# Patient Record
Sex: Female | Born: 1960 | Race: White | Hispanic: No | Marital: Married | Smoking: Former smoker
Health system: Southern US, Community
[De-identification: ages and names within clinical notes are randomized; demographics above are authoritative.]

## PROBLEM LIST (undated history)

## (undated) DIAGNOSIS — Z87442 Personal history of urinary calculi: Secondary | ICD-10-CM

## (undated) DIAGNOSIS — I10 Essential (primary) hypertension: Secondary | ICD-10-CM

## (undated) DIAGNOSIS — M1990 Unspecified osteoarthritis, unspecified site: Secondary | ICD-10-CM

## (undated) DIAGNOSIS — C801 Malignant (primary) neoplasm, unspecified: Secondary | ICD-10-CM

## (undated) DIAGNOSIS — E079 Disorder of thyroid, unspecified: Secondary | ICD-10-CM

## (undated) DIAGNOSIS — E039 Hypothyroidism, unspecified: Secondary | ICD-10-CM

## (undated) HISTORY — PX: ABDOMINAL HYSTERECTOMY: SHX81

## (undated) HISTORY — PX: CHOLECYSTECTOMY: SHX55

## (undated) HISTORY — PX: TONSILLECTOMY: SUR1361

## (undated) HISTORY — PX: CARPAL TUNNEL RELEASE: SHX101

---

## 1982-09-04 DIAGNOSIS — C801 Malignant (primary) neoplasm, unspecified: Secondary | ICD-10-CM

## 1982-09-04 HISTORY — DX: Malignant (primary) neoplasm, unspecified: C80.1

## 1998-10-01 ENCOUNTER — Other Ambulatory Visit: Admission: RE | Admit: 1998-10-01 | Discharge: 1998-10-01 | Payer: Self-pay | Admitting: Orthopedic Surgery

## 2005-06-29 ENCOUNTER — Ambulatory Visit (HOSPITAL_COMMUNITY): Admission: RE | Admit: 2005-06-29 | Discharge: 2005-06-29 | Payer: Self-pay | Admitting: Internal Medicine

## 2008-04-24 ENCOUNTER — Ambulatory Visit (HOSPITAL_COMMUNITY): Admission: RE | Admit: 2008-04-24 | Discharge: 2008-04-24 | Payer: Self-pay | Admitting: Internal Medicine

## 2015-02-24 ENCOUNTER — Encounter (INDEPENDENT_AMBULATORY_CARE_PROVIDER_SITE_OTHER): Payer: Self-pay | Admitting: *Deleted

## 2015-04-28 ENCOUNTER — Emergency Department (HOSPITAL_COMMUNITY): Payer: 59

## 2015-04-28 ENCOUNTER — Emergency Department (HOSPITAL_COMMUNITY)
Admission: EM | Admit: 2015-04-28 | Discharge: 2015-04-28 | Disposition: A | Discharge status: Home / Self Care | Payer: 59 | Attending: Emergency Medicine | Admitting: Emergency Medicine

## 2015-04-28 ENCOUNTER — Encounter (HOSPITAL_COMMUNITY): Payer: Self-pay | Admitting: Emergency Medicine

## 2015-04-28 DIAGNOSIS — N200 Calculus of kidney: Secondary | ICD-10-CM | POA: Diagnosis not present

## 2015-04-28 DIAGNOSIS — Z79899 Other long term (current) drug therapy: Secondary | ICD-10-CM | POA: Insufficient documentation

## 2015-04-28 DIAGNOSIS — E079 Disorder of thyroid, unspecified: Secondary | ICD-10-CM | POA: Insufficient documentation

## 2015-04-28 DIAGNOSIS — R109 Unspecified abdominal pain: Secondary | ICD-10-CM | POA: Diagnosis present

## 2015-04-28 HISTORY — DX: Disorder of thyroid, unspecified: E07.9

## 2015-04-28 LAB — URINALYSIS, ROUTINE W REFLEX MICROSCOPIC
Bilirubin Urine: NEGATIVE
Glucose, UA: NEGATIVE mg/dL
Ketones, ur: NEGATIVE mg/dL
Leukocytes, UA: NEGATIVE
Nitrite: NEGATIVE
Specific Gravity, Urine: >1.03 — ABNORMAL HIGH (ref 1.005–1.030)
Urobilinogen, UA: 0.2 mg/dL (ref 0.0–1.0)
pH: 5.5 (ref 5.0–8.0)

## 2015-04-28 LAB — URINE MICROSCOPIC-ADD ON

## 2015-04-28 MED ORDER — OXYCODONE-ACETAMINOPHEN 5-325 MG PO TABS
1.0000 | ORAL_TABLET | Freq: Once | ORAL | Status: AC
Start: 2015-04-28 — End: 2015-04-28
  Administered 2015-04-28: 1 via ORAL
  Filled 2015-04-28: qty 1

## 2015-04-28 MED ORDER — TAMSULOSIN HCL 0.4 MG PO CAPS
0.4000 mg | ORAL_CAPSULE | Freq: Every day | ORAL | Status: DC
Start: 2015-04-28 — End: 2018-08-19

## 2015-04-28 MED ORDER — ONDANSETRON 8 MG PO TBDP
8.0000 mg | ORAL_TABLET | Freq: Once | ORAL | Status: AC
  Administered 2015-04-28: 8 mg via ORAL
  Filled 2015-04-28: qty 1

## 2015-04-28 MED ORDER — ONDANSETRON 8 MG PO TBDP: 8.0000 mg | ORAL_TABLET | Freq: Once | ORAL | Status: DC

## 2015-04-28 MED ORDER — KETOROLAC TROMETHAMINE 30 MG/ML IJ SOLN
30.0000 mg | Freq: Once | INTRAMUSCULAR | Status: AC
  Administered 2015-04-28: 30 mg via INTRAMUSCULAR
  Filled 2015-04-28: qty 1

## 2015-04-28 MED ORDER — HYDROCODONE-ACETAMINOPHEN 5-325 MG PO TABS: 1.0000 | ORAL_TABLET | Freq: Four times a day (QID) | ORAL | Status: DC | PRN

## 2015-04-28 NOTE — ED Provider Notes (Signed)
MSE was initiated and I personally evaluated the patient and placed orders (if any) at  6:52 AM on April 28, 2015.  The patient appears stable so that the remainder of the MSE may be completed by another provider. Pt declines IV pain meds at this time   Ripley Fraise, MD 04/28/15 [phone]

## 2015-04-28 NOTE — ED Provider Notes (Signed)
CSN: [id number]     Arrival date & time 04/28/15  0600 History   First MD Initiated Contact with Patient 04/28/15 [phone]     Chief Complaint  Patient presents with  . Flank Pain     (Consider location/radiation/quality/duration/timing/severity/associated sxs/prior Treatment) HPI Patient presents with new right-sided flank pain. Pain began with the past 2 hours, starting gradually. Since onset pain has been persistent, with waxing/waning severity. The pain is focally in the right flank, nonradiating. There is no associated dysuria, hematuria, other abdominal or thoracic pain. There is associated nausea, no vomiting. Patient states that the pain is similar to pain she expressed during prior kidney stone, in the distant past. She states that she is otherwise generally well. Since arrival to the facility the patient has received medication, states that this has diminished her pain.  Past Medical History  Diagnosis Date  . Thyroid disease    Past Surgical History  Procedure Laterality Date  . Abdominal hysterectomy    . Tonsillectomy    . Cholecystectomy     History reviewed. No pertinent family history. Social History  Substance Use Topics  . Smoking status: Never Smoker   . Smokeless tobacco: None  . Alcohol Use: No   OB History    No data available     Review of Systems  Constitutional:       Per HPI, otherwise negative  HENT:       Per HPI, otherwise negative  Respiratory:       Per HPI, otherwise negative  Cardiovascular:       Per HPI, otherwise negative  Gastrointestinal: Negative for vomiting.  Endocrine:       Negative aside from HPI  Genitourinary:       Neg aside from HPI   Musculoskeletal:       Per HPI, otherwise negative  Skin: Negative.   Neurological: Negative for syncope.      Allergies  Review of patient's allergies indicates not on file.  Home Medications   Prior to Admission medications   Medication Sig Start Date End Date Taking?  Authorizing Provider  levothyroxine (SYNTHROID, LEVOTHROID) 125 MCG tablet Take 125 mcg by mouth daily before breakfast.   Yes Historical Provider, MD   BP 197/95 mmHg  Pulse 61  Temp(Src) 97.6 F (36.4 C)  Resp 18  Ht 5\' 7"  (1.702 m)  Wt 260 lb (117.935 kg)  BMI 40.71 kg/m2  SpO2 97% Physical Exam  Constitutional: She is oriented to person, place, and time. She appears well-developed and well-nourished. No distress.  HENT:  Head: Normocephalic and atraumatic.  Eyes: Conjunctivae and EOM are normal.  Cardiovascular: Normal rate and regular rhythm.   Pulmonary/Chest: Effort normal and breath sounds normal. No stridor. No respiratory distress.  Abdominal: She exhibits no distension.  Mild tenderness to palpation about the right lateral flank  Musculoskeletal: She exhibits no edema.  Neurological: She is alert and oriented to person, place, and time. No cranial nerve deficit.  Skin: Skin is warm and dry.  Psychiatric: She has a normal mood and affect.  Nursing note and vitals reviewed.   ED Course  Procedures (including critical care time) Labs Review Labs Reviewed  URINALYSIS, ROUTINE W REFLEX MICROSCOPIC (NOT AT [facility]) - Abnormal; Notable for the following:    APPearance HAZY (*)    Specific Gravity, Urine >1.030 (*)    Hgb urine dipstick LARGE (*)    Protein, ur TRACE (*)    All other components within normal limits  URINE MICROSCOPIC-ADD ON - Abnormal; Notable for the following:    Bacteria, UA MANY (*)    All other components within normal limits    Imaging Review US Renal  04/28/2015   CLINICAL DATA:  Right flank pain  EXAM: RENAL / URINARY TRACT ULTRASOUND COMPLETE  COMPARISON:  None.  FINDINGS: Right Kidney:  Length: 12.2 cm. No mass. Normal echogenicity. Mild caliectasis. The pelvis in ureter are nondilated. This is of unknown significance.  Left Kidney:  Length: 12.4 cm. Echogenicity within normal limits. No mass or hydronephrosis visualized.  Bladder:  Appears  normal for degree of bladder distention.  IMPRESSION: Mild right renal caliectasis of unknown significance. For lead right ureteral obstruction cannot be excluded. Correlate clinically as for the need for further imaging.   Electronically Signed   By: Marybelle Killings M.D.   On: 04/28/2015 10:10   Ct Renal Stone Study  04/28/2015   CLINICAL DATA:  Right flank pain since last night  EXAM: CT ABDOMEN AND PELVIS WITHOUT CONTRAST  TECHNIQUE: Multidetector CT imaging of the abdomen and pelvis was performed following the standard protocol without IV contrast.  COMPARISON:  None.  FINDINGS: 3 mm right UPJ calculus is associated with mild right hydronephrosis. There is a tiny calculus in the right renal pelvis. The right renal pelvis and ureter are nondilated. No left renal calculi or left ureteral calculus.  Postcholecystectomy  Unenhanced liver, spleen, pancreas, adrenal glands are within normal limits.  Normal appendix.  Sigmoid diverticulosis without diverticulitis. Bladder is decompressed. Uterus is absent. Adnexa are unremarkable.  There is severe spinal stenosis at L4-5 likely due to facet arthropathy and malalignment. Severe degenerative disc disease occurs at L5-S1.  IMPRESSION: 3 mm right UPJ calculus is associated with mild secondary findings of right ureteral obstruction.  L4-5 lumbar spinal stenosis.   Electronically Signed   By: Marybelle Killings M.D.   On: 04/28/2015 10:44   I have personally reviewed and evaluated these images and lab results as part of my medical decision-making.  11:00 AM On repeat exam the patient is in no distress. We discussed all findings at length, including evidence for stone, and the need to follow-up with urology. Pain has diminished considerably  MDM  Patient presents with new right flank pain, is found to have kidney stone. No evidence for infection. There is mild obstruction, but the patient continues to produce urine, and given the size of stone, there is no evidence for  complete obstruction. Patient's pain was controlled here, she was discharged in stable condition to follow-up with primary care, urology.   Carmin Muskrat, MD 04/28/15 1101

## 2015-04-28 NOTE — ED Notes (Signed)
Pt c/o right flank with nausea.

## 2015-04-28 NOTE — Discharge Instructions (Signed)
As discussed, it is important that you take all medication as directed, monitor your condition carefully, and do not hesitate to return here for concerning changes in your condition.  Otherwise, please be sure to follow-up with our urologists   Kidney Stones Kidney stones (urolithiasis) are solid masses that form inside your kidneys. The intense pain is caused by the stone moving through the kidney, ureter, bladder, and urethra (urinary tract). When the stone moves, the ureter starts to spasm around the stone. The stone is usually passed in your pee (urine).  HOME CARE  Drink enough fluids to keep your pee clear or pale yellow. This helps to get the stone out.  Strain all pee through the provided strainer. Do not pee without peeing through the strainer, not even once. If you pee the stone out, catch it in the strainer. The stone may be as small as a grain of salt. Take this to your doctor. This will help your doctor figure out what you can do to try to prevent more kidney stones.  Only take medicine as told by your doctor.  Follow up with your doctor as told.  Get follow-up X-rays as told by your doctor. GET HELP IF: You have pain that gets worse even if you have been taking pain medicine. GET HELP RIGHT AWAY IF:   Your pain does not get better with medicine.  You have a fever or shaking chills.  Your pain increases and gets worse over 18 hours.  You have new belly (abdominal) pain.  You feel faint or pass out.  You are unable to pee. MAKE SURE YOU:   Understand these instructions.  Will watch your condition.  Will get help right away if you are not doing well or get worse. Document Released: 02/07/2008 Document Revised: 04/23/2013 Document Reviewed: 01/22/2013 [facility] Patient Information 2015 [place], [place]. This information is not intended to replace advice given to you by your health care provider. Make sure you discuss any questions you have with your health care  provider.

## 2015-04-28 NOTE — ED Notes (Signed)
Pt states relative relief from pain & nausea. Says she drank a cup of water, but still isn't able to urinate to provide UA sample. Gave her another cup of ice water.

## 2015-04-28 NOTE — ED Notes (Signed)
Patient with no complaints at this time. Respirations even and unlabored. Skin warm/dry. Discharge instructions reviewed with patient at this time. Patient given opportunity to voice concerns/ask questions. Patient discharged at this time and left Emergency Department with steady gait.   

## 2015-04-28 NOTE — ED Notes (Signed)
MD at bedside. 

## 2015-06-23 ENCOUNTER — Other Ambulatory Visit (HOSPITAL_COMMUNITY): Payer: Self-pay | Admitting: Internal Medicine

## 2015-06-23 DIAGNOSIS — R945 Abnormal results of liver function studies: Secondary | ICD-10-CM

## 2015-06-28 ENCOUNTER — Ambulatory Visit (HOSPITAL_COMMUNITY)
Admission: RE | Admit: 2015-06-28 | Discharge: 2015-06-28 | Disposition: A | Discharge status: Home / Self Care | Payer: 59 | Source: Ambulatory Visit | Attending: Internal Medicine | Admitting: Internal Medicine

## 2015-06-28 DIAGNOSIS — K760 Fatty (change of) liver, not elsewhere classified: Secondary | ICD-10-CM | POA: Diagnosis not present

## 2015-06-28 DIAGNOSIS — Z9049 Acquired absence of other specified parts of digestive tract: Secondary | ICD-10-CM | POA: Insufficient documentation

## 2015-06-28 DIAGNOSIS — R945 Abnormal results of liver function studies: Secondary | ICD-10-CM | POA: Insufficient documentation

## 2015-06-28 DIAGNOSIS — N200 Calculus of kidney: Secondary | ICD-10-CM | POA: Diagnosis not present

## 2015-06-28 DIAGNOSIS — Z87442 Personal history of urinary calculi: Secondary | ICD-10-CM | POA: Diagnosis not present

## 2018-07-02 ENCOUNTER — Encounter: Payer: Self-pay | Admitting: Internal Medicine

## 2018-07-02 LAB — COLOGUARD

## 2018-08-19 ENCOUNTER — Ambulatory Visit (INDEPENDENT_AMBULATORY_CARE_PROVIDER_SITE_OTHER): Payer: BLUE CROSS/BLUE SHIELD | Admitting: Internal Medicine

## 2018-08-19 ENCOUNTER — Telehealth (INDEPENDENT_AMBULATORY_CARE_PROVIDER_SITE_OTHER): Payer: Self-pay | Admitting: *Deleted

## 2018-08-19 ENCOUNTER — Encounter (INDEPENDENT_AMBULATORY_CARE_PROVIDER_SITE_OTHER): Payer: Self-pay | Admitting: Internal Medicine

## 2018-08-19 ENCOUNTER — Ambulatory Visit (INDEPENDENT_AMBULATORY_CARE_PROVIDER_SITE_OTHER): Payer: Self-pay | Admitting: Internal Medicine

## 2018-08-19 ENCOUNTER — Encounter (INDEPENDENT_AMBULATORY_CARE_PROVIDER_SITE_OTHER): Payer: Self-pay | Admitting: *Deleted

## 2018-08-19 VITALS — BP 130/78 | HR 76 | Temp 97.8°F | Ht 67.0 in | Wt 299.7 lb

## 2018-08-19 DIAGNOSIS — R195 Other fecal abnormalities: Secondary | ICD-10-CM | POA: Diagnosis not present

## 2018-08-19 MED ORDER — SUPREP BOWEL PREP KIT 17.5-3.13-1.6 GM/177ML PO SOLN: 1.0000 | Freq: Once | ORAL | 0 refills | Status: AC

## 2018-08-19 NOTE — Patient Instructions (Signed)
The risks of bleeding, perforation and infection were reviewed with patient.  

## 2018-08-19 NOTE — Telephone Encounter (Signed)
Patient needs suprep 

## 2018-08-19 NOTE — Progress Notes (Signed)
   Subjective:    Patient ID: Alexa [name], female    DOB: [birth year], 57 y.o.   MRN: [id number]  HPI Referred by Dr Nevada Crane for positive cologuard.She denies seeing any blood. BMs are normal. She does have urgency. She says her rectal muscle is weak because of child birth. She says her appetite is good. No unintentional weight loss. No GI complaints No NSAIDs except for Aleve which is rare . No family hx of colon cancer.  Has never undergone a colonoscopy in the past.   Review of Systems Past Medical History:  Diagnosis Date  . Thyroid disease     Past Surgical History:  Procedure Laterality Date  . ABDOMINAL HYSTERECTOMY    . CHOLECYSTECTOMY    . TONSILLECTOMY      Allergies  Allergen Reactions  . Codeine Nausea Only    Current Outpatient Medications on File Prior to Visit  Medication Sig Dispense Refill  . levothyroxine (SYNTHROID, LEVOTHROID) 125 MCG tablet Take 125 mcg by mouth daily before breakfast.    . naproxen sodium (ANAPROX) 220 MG tablet Take 220 mg by mouth daily as needed (pain).    . phentermine 30 MG capsule Take by mouth every morning.     No current facility-administered medications on file prior to visit.         Objective:   Physical Exam Blood pressure 130/78, pulse 76, temperature 97.8 F (36.6 C), height 5\' 7"  (1.702 m), weight 299 lb 11.2 oz (135.9 kg). Alert and oriented. Skin warm and dry. Oral mucosa is moist.   . Sclera anicteric, conjunctivae is pink. Thyroid not enlarged. No cervical lymphadenopathy. Lungs clear. Heart regular rate and rhythm.  Abdomen is soft. Bowel sounds are positive. No hepatomegaly. No abdominal masses felt. No tenderness.  No edema to lower extremities.         Assessment & Plan:  Positive cologuard. Colonic neoplasm or polyps needs to be ruled out. The risks of bleeding, perforation and infection were reviewed with patient. Colonoscopy

## 2018-09-23 ENCOUNTER — Ambulatory Visit (HOSPITAL_COMMUNITY)
Admission: RE | Admit: 2018-09-23 | Discharge: 2018-09-23 | Disposition: A | Discharge status: Home / Self Care | Payer: BLUE CROSS/BLUE SHIELD | Attending: Internal Medicine | Admitting: Internal Medicine

## 2018-09-23 ENCOUNTER — Encounter (HOSPITAL_COMMUNITY): Payer: Self-pay | Admitting: Anesthesiology

## 2018-09-23 ENCOUNTER — Encounter (HOSPITAL_COMMUNITY)
Admission: RE | Disposition: A | Discharge status: Home / Self Care | Payer: Self-pay | Source: Home / Self Care | Attending: Internal Medicine

## 2018-09-23 ENCOUNTER — Encounter (HOSPITAL_COMMUNITY): Payer: Self-pay | Admitting: *Deleted

## 2018-09-23 ENCOUNTER — Other Ambulatory Visit: Payer: Self-pay

## 2018-09-23 DIAGNOSIS — D123 Benign neoplasm of transverse colon: Secondary | ICD-10-CM | POA: Diagnosis not present

## 2018-09-23 DIAGNOSIS — Z8542 Personal history of malignant neoplasm of other parts of uterus: Secondary | ICD-10-CM | POA: Diagnosis not present

## 2018-09-23 DIAGNOSIS — Z87891 Personal history of nicotine dependence: Secondary | ICD-10-CM | POA: Insufficient documentation

## 2018-09-23 DIAGNOSIS — E039 Hypothyroidism, unspecified: Secondary | ICD-10-CM | POA: Insufficient documentation

## 2018-09-23 DIAGNOSIS — D125 Benign neoplasm of sigmoid colon: Secondary | ICD-10-CM | POA: Insufficient documentation

## 2018-09-23 DIAGNOSIS — K5730 Diverticulosis of large intestine without perforation or abscess without bleeding: Secondary | ICD-10-CM | POA: Diagnosis not present

## 2018-09-23 DIAGNOSIS — Z79890 Hormone replacement therapy (postmenopausal): Secondary | ICD-10-CM | POA: Diagnosis not present

## 2018-09-23 DIAGNOSIS — R195 Other fecal abnormalities: Secondary | ICD-10-CM

## 2018-09-23 DIAGNOSIS — K635 Polyp of colon: Secondary | ICD-10-CM | POA: Diagnosis not present

## 2018-09-23 HISTORY — DX: Unspecified osteoarthritis, unspecified site: M19.90

## 2018-09-23 HISTORY — DX: Personal history of urinary calculi: Z87.442

## 2018-09-23 HISTORY — DX: Malignant (primary) neoplasm, unspecified: C80.1

## 2018-09-23 HISTORY — PX: POLYPECTOMY: SHX5525

## 2018-09-23 HISTORY — PX: COLONOSCOPY: SHX5424

## 2018-09-23 HISTORY — DX: Hypothyroidism, unspecified: E03.9

## 2018-09-23 SURGERY — COLONOSCOPY
Anesthesia: Moderate Sedation

## 2018-09-23 MED ORDER — MEPERIDINE HCL 50 MG/ML IJ SOLN
INTRAMUSCULAR | Status: AC
  Filled 2018-09-23: qty 1

## 2018-09-23 MED ORDER — SODIUM CHLORIDE 0.9 % IV SOLN
INTRAVENOUS | Status: DC
  Administered 2018-09-23: 1000 mL via INTRAVENOUS

## 2018-09-23 MED ORDER — MEPERIDINE HCL 50 MG/ML IJ SOLN
INTRAMUSCULAR | Status: DC | PRN
  Administered 2018-09-23 (×2): 25 mg via INTRAVENOUS

## 2018-09-23 MED ORDER — MIDAZOLAM HCL 5 MG/5ML IJ SOLN
INTRAMUSCULAR | Status: DC | PRN
  Administered 2018-09-23: 1 mg via INTRAVENOUS
  Administered 2018-09-23: 2 mg via INTRAVENOUS
  Administered 2018-09-23: 3 mg via INTRAVENOUS
  Administered 2018-09-23: 2 mg via INTRAVENOUS
  Administered 2018-09-23: 1 mg via INTRAVENOUS

## 2018-09-23 MED ORDER — STERILE WATER FOR IRRIGATION IR SOLN
Status: DC | PRN
  Administered 2018-09-23: 11:00:00

## 2018-09-23 MED ORDER — MIDAZOLAM HCL 5 MG/5ML IJ SOLN
INTRAMUSCULAR | Status: AC
  Filled 2018-09-23: qty 10

## 2018-09-23 NOTE — Discharge Instructions (Signed)
No aspirin or NSAIDs for 24 hours. Resume other medications as before. High-fiber diet. No driving for 24 hours. Physician will call with biopsy results.  PATIENT INSTRUCTIONS POST-ANESTHESIA  IMMEDIATELY FOLLOWING SURGERY:  Do not drive or operate machinery for the first twenty four hours after surgery.  Do not make any important decisions for twenty four hours after surgery or while taking narcotic pain medications or sedatives.  If you develop intractable nausea and vomiting or a severe headache please notify your doctor immediately.  FOLLOW-UP:  Please make an appointment with your surgeon as instructed. You do not need to follow up with anesthesia unless specifically instructed to do so.  WOUND CARE INSTRUCTIONS (if applicable):  Keep a dry clean dressing on the anesthesia/puncture wound site if there is drainage.  Once the wound has quit draining you may leave it open to air.  Generally you should leave the bandage intact for twenty four hours unless there is drainage.  If the epidural site drains for more than 36-48 hours please call the anesthesia department.  QUESTIONS?:  Please feel free to call your physician or the hospital operator if you have any questions, and they will be happy to assist you.      Colonoscopy, Adult, Care After This sheet gives you information about how to care for yourself after your procedure. Your doctor may also give you more specific instructions. If you have problems or questions, call your doctor. What can I expect after the procedure? After the procedure, it is common to have:  A small amount of blood in your poop for 24 hours.  Some gas.  Mild cramping or bloating in your belly. Follow these instructions at home: General instructions  For the first 24 hours after the procedure: ? Do not drive or use machinery. ? Do not sign important documents. ? Do not drink alcohol. ? Do your daily activities more slowly than normal. ? Eat foods that are  soft and easy to digest.  Take over-the-counter or prescription medicines only as told by your doctor. To help cramping and bloating:   Try walking around.  Put heat on your belly (abdomen) as told by your doctor. Use a heat source that your doctor recommends, such as a moist heat pack or a heating pad. ? Put a towel between your skin and the heat source. ? Leave the heat on for 20-30 minutes. ? Remove the heat if your skin turns bright red. This is especially important if you cannot feel pain, heat, or cold. You can get burned. Eating and drinking   Drink enough fluid to keep your pee (urine) clear or pale yellow.  Return to your normal diet as told by your doctor. Avoid heavy or fried foods that are hard to digest.  Avoid drinking alcohol for as long as told by your doctor. Contact a doctor if:  You have blood in your poop (stool) 2-3 days after the procedure. Get help right away if:  You have more than a small amount of blood in your poop.  You see large clumps of tissue (blood clots) in your poop.  Your belly is swollen.  You feel sick to your stomach (nauseous).  You throw up (vomit).  You have a fever.  You have belly pain that gets worse, and medicine does not help your pain. Summary  After the procedure, it is common to have a small amount of blood in your poop. You may also have mild cramping and bloating in your  belly.  For the first 24 hours after the procedure, do not drive or use machinery, do not sign important documents, and do not drink alcohol.  Get help right away if you have a lot of blood in your poop, feel sick to your stomach, have a fever, or have more belly pain. This information is not intended to replace advice given to you by your health care provider. Make sure you discuss any questions you have with your health care provider. Document Released: 09/23/2010 Document Revised: 06/21/2017 Document Reviewed: 05/15/2016 Elsevier Interactive Patient  Education  2019 [employer].   Colon Polyps  Polyps are tissue growths inside the body. Polyps can grow in many places, including the large intestine (colon). A polyp may be a round bump or a mushroom-shaped growth. You could have one polyp or several. Most colon polyps are noncancerous (benign). However, some colon polyps can become cancerous over time. Finding and removing the polyps early can help prevent this. What are the causes? The exact cause of colon polyps is not known. What increases the risk? You are more likely to develop this condition if you:  Have a family history of colon cancer or colon polyps.  Are older than 77 or older than 45 if you are African American.  Have inflammatory bowel disease, such as ulcerative colitis or Crohn's disease.  Have certain hereditary conditions, such as: ? Familial adenomatous polyposis. ? Lynch syndrome. ? Turcot syndrome. ? Peutz-Jeghers syndrome.  Are overweight.  Smoke cigarettes.  Do not get enough exercise.  Drink too much alcohol.  Eat a diet that is high in fat and red meat and low in fiber.  Had childhood cancer that was treated with abdominal radiation. What are the signs or symptoms? Most polyps do not cause symptoms. If you have symptoms, they may include:  Blood coming from your rectum when having a bowel movement.  Blood in your stool. The stool may look dark red or black.  Abdominal pain.  A change in bowel habits, such as constipation or diarrhea. How is this diagnosed? This condition is diagnosed with a colonoscopy. This is a procedure in which a lighted, flexible scope is inserted into the anus and then passed into the colon to examine the area. Polyps are sometimes found when a colonoscopy is done as part of routine cancer screening tests. How is this treated? Treatment for this condition involves removing any polyps that are found. Most polyps can be removed during a colonoscopy. Those polyps will  then be tested for cancer. Additional treatment may be needed depending on the results of testing. Follow these instructions at home: Lifestyle  Maintain a healthy weight, or lose weight if recommended by your health care provider.  Exercise every day or as told by your health care provider.  Do not use any products that contain nicotine or tobacco, such as cigarettes and e-cigarettes. If you need help quitting, ask your health care provider.  If you drink alcohol, limit how much you have: ? 0-1 drink a day for women. ? 0-2 drinks a day for men.  Be aware of how much alcohol is in your drink. In the [place] one drink equals one 12 oz bottle of beer (355 mL), one 5 oz glass of wine (148 mL), or one 1 oz shot of hard liquor (44 mL). Eating and drinking   Eat foods that are high in fiber, such as fruits, vegetables, and whole grains.  Eat foods that are high in calcium and  vitamin D, such as milk, cheese, yogurt, eggs, liver, fish, and broccoli.  Limit foods that are high in fat, such as fried foods and desserts.  Limit the amount of red meat and processed meat you eat, such as hot dogs, sausage, bacon, and lunch meats. General instructions  Keep all follow-up visits as told by your health care provider. This is important. ? This includes having regularly scheduled colonoscopies. ? Talk to your health care provider about when you need a colonoscopy. Contact a health care provider if:  You have new or worsening bleeding during a bowel movement.  You have new or increased blood in your stool.  You have a change in bowel habits.  You lose weight for no known reason. Summary  Polyps are tissue growths inside the body. Polyps can grow in many places, including the colon.  Most colon polyps are noncancerous (benign), but some can become cancerous over time.  This condition is diagnosed with a colonoscopy.  Treatment for this condition involves removing any polyps that are found.  Most polyps can be removed during a colonoscopy. This information is not intended to replace advice given to you by your health care provider. Make sure you discuss any questions you have with your health care provider. Document Released: 05/17/2004 Document Revised: 12/06/2017 Document Reviewed: 12/06/2017 Elsevier Interactive Patient Education  2019 [employer].   Diverticulosis  Diverticulosis is a condition that develops when small pouches (diverticula) form in the wall of the large intestine (colon). The colon is where water is absorbed and stool is formed. The pouches form when the inside layer of the colon pushes through weak spots in the outer layers of the colon. You may have a few pouches or many of them. What are the causes? The cause of this condition is not known. What increases the risk? The following factors may make you more likely to develop this condition:  Being older than age 69. Your risk for this condition increases with age. Diverticulosis is rare among people younger than age 62. By age 11, many people have it.  Eating a low-fiber diet.  Having frequent constipation.  Being overweight.  Not getting enough exercise.  Smoking.  Taking over-the-counter pain medicines, like aspirin and ibuprofen.  Having a family history of diverticulosis. What are the signs or symptoms? In most people, there are no symptoms of this condition. If you do have symptoms, they may include:  Bloating.  Cramps in the abdomen.  Constipation or diarrhea.  Pain in the lower left side of the abdomen. How is this diagnosed? This condition is most often diagnosed during an exam for other colon problems. Because diverticulosis usually has no symptoms, it often cannot be diagnosed independently. This condition may be diagnosed by:  Using a flexible scope to examine the colon (colonoscopy).  Taking an X-ray of the colon after dye has been put into the colon (barium enema).  Doing  a CT scan. How is this treated? You may not need treatment for this condition if you have never developed an infection related to diverticulosis. If you have had an infection before, treatment may include:  Eating a high-fiber diet. This may include eating more fruits, vegetables, and grains.  Taking a fiber supplement.  Taking a live bacteria supplement (probiotic).  Taking medicine to relax your colon.  Taking antibiotic medicines. Follow these instructions at home:  Drink 6-8 glasses of water or more each day to prevent constipation.  Try not to strain when you have  a bowel movement.  If you have had an infection before: ? Eat more fiber as directed by your health care provider or your diet and nutrition specialist (dietitian). ? Take a fiber supplement or probiotic, if your health care provider approves.  Take over-the-counter and prescription medicines only as told by your health care provider.  If you were prescribed an antibiotic, take it as told by your health care provider. Do not stop taking the antibiotic even if you start to feel better.  Keep all follow-up visits as told by your health care provider. This is important. Contact a health care provider if:  You have pain in your abdomen.  You have bloating.  You have cramps.  You have not had a bowel movement in 3 days. Get help right away if:  Your pain gets worse.  Your bloating becomes very bad.  You have a fever or chills, and your symptoms suddenly get worse.  You vomit.  You have bowel movements that are bloody or black.  You have bleeding from your rectum. Summary  Diverticulosis is a condition that develops when small pouches (diverticula) form in the wall of the large intestine (colon).  You may have a few pouches or many of them.  This condition is most often diagnosed during an exam for other colon problems.  If you have had an infection related to diverticulosis, treatment may include  increasing the fiber in your diet, taking supplements, or taking medicines. This information is not intended to replace advice given to you by your health care provider. Make sure you discuss any questions you have with your health care provider. Document Released: 05/18/2004 Document Revised: 07/10/2016 Document Reviewed: 07/10/2016 Elsevier Interactive Patient Education  2019 [employer].

## 2018-09-23 NOTE — H&P (Signed)
Alexa [name] is an 57 y.o. female.   Chief Complaint: Patient is here for colonoscopy. HPI: Patient is 58 year old Caucasian female who underwent Cologuard test for screening purposes and it came back positive.  She denied abdominal pain change in bowel habits or rectal bleeding.  She takes BC powder or naproxen OTC once in a while. Family history is negative for CRC.  Past Medical History:  Diagnosis Date  . Arthritis    back, legs, knees  . Cancer ([place]) 1984   uterine  . History of kidney stones   . Hypothyroidism   . Thyroid disease     Past Surgical History:  Procedure Laterality Date  . ABDOMINAL HYSTERECTOMY    . CARPAL TUNNEL RELEASE Right   . CHOLECYSTECTOMY    . TONSILLECTOMY      History reviewed. No pertinent family history. Social History:  reports that she has quit smoking. She has never used smokeless tobacco. She reports that she does not drink alcohol or use drugs.  Allergies:  Allergies  Allergen Reactions  . Codeine Nausea Only    Medications Prior to Admission  Medication Sig Dispense Refill  . Aspirin-Acetaminophen-Caffeine (GOODY HEADACHE PO) Take 1 packet by mouth daily as needed (pain).    Marland Kitchen levothyroxine (SYNTHROID, LEVOTHROID) 137 MCG tablet Take 137 mcg by mouth daily before breakfast.    . naproxen sodium (ANAPROX) 220 MG tablet Take 440 mg by mouth daily as needed (pain).     . phentermine 37.5 MG capsule Take 37.5 mg by mouth every morning.      No results found for this or any previous visit (from the past 48 hour(s)). No results found.  ROS  Blood pressure (!) 141/69, pulse 75, temperature (!) 97.4 F (36.3 C), temperature source Oral, resp. rate 17, height 5\' 6"  (1.676 m), weight 131.1 kg. Physical Exam  Constitutional:  Obese Caucasian female in NAD.  HENT:  Mouth/Throat: Oropharynx is clear and moist.  Eyes: Conjunctivae are normal. No scleral icterus.  Neck: No thyromegaly present.  Cardiovascular: Normal rate, regular  rhythm and normal heart sounds.  No murmur heard. Respiratory: Effort normal and breath sounds normal.  GI:   Abdomen is full.  Pfannenstiel scar.  Abdomen soft and nontender with organomegaly or masses.  Musculoskeletal:        General: No edema.  Lymphadenopathy:    She has no cervical adenopathy.  Neurological: She is alert.  Skin: Skin is warm and dry.     Assessment/Plan Positive Cologuard test. Diagnostic colonoscopy.  Hildred Laser, MD 09/23/2018, 10:32 AM

## 2018-09-23 NOTE — Op Note (Signed)
[facility] Patient Name: Alexa [name] Procedure Date: 09/23/2018 10:16 AM MRN: [id number] Date of Birth: 1960-09-13 Attending MD: Hildred Laser , MD CSN: [id number] Age: 58 Admit Type: Outpatient Procedure:                Colonoscopy Indications:              Positive Cologuard test Providers:                Hildred Laser, MD, Hinton Rao, RN, Gerome Sam, RN, Randa Spike, Technician Referring MD:             Delphina Cahill, MD Medicines:                Meperidine 50 mg IV, Midazolam 9 mg IV Complications:            No immediate complications. Estimated Blood Loss:     Estimated blood loss was minimal. Procedure:                Pre-Anesthesia Assessment:                           - Prior to the procedure, a History and Physical                            was performed, and patient medications and                            allergies were reviewed. The patient's tolerance of                            previous anesthesia was also reviewed. The risks                            and benefits of the procedure and the sedation                            options and risks were discussed with the patient.                            All questions were answered, and informed consent                            was obtained. Prior Anticoagulants: The patient                            last took naproxen 4 days prior to the procedure.                            ASA Grade Assessment: II - A patient with mild                            systemic disease. After reviewing the risks and  benefits, the patient was deemed in satisfactory                            condition to undergo the procedure.                           After obtaining informed consent, the colonoscope                            was passed under direct vision. Throughout the                            procedure, the patient's blood pressure, pulse, and             oxygen saturations were monitored continuously. The                            [id number]-[id number] ([id number]) scope was introduced through                            the anus and advanced to the the cecum, identified                            by appendiceal orifice and ileocecal valve. The                            colonoscopy was performed without difficulty. The                            patient tolerated the procedure well. The quality                            of the bowel preparation was excellent. The                            ileocecal valve, appendiceal orifice, and rectum                            were photographed. Scope In: 10:44:48 AM Scope Out: 11:05:56 AM Scope Withdrawal Time: 0 hours 11 minutes 51 seconds  Total Procedure Duration: 0 hours 21 minutes 8 seconds  Findings:      The perianal and digital rectal examinations were normal.      A small polyp was found in the hepatic flexure. The polyp was sessile.       Biopsies were taken with a cold forceps for histology. The pathology       specimen was placed into Bottle Number 2.      Two sessile polyps were found in the sigmoid colon and splenic flexure.       The polyps were small in size. These polyps were removed with a cold       snare. Resection and retrieval were complete. The pathology specimen was       placed into Bottle Number 1.      A small polyp was found in the splenic flexure. The polyp was sessile.  Biopsies were taken with a cold forceps for histology. The pathology       specimen was placed into Bottle Number 1.      A few small-mouthed diverticula were found in the sigmoid colon.      The retroflexed view of the distal rectum and anal verge was normal and       showed no anal or rectal abnormalities. Impression:               - One small polyp at the hepatic flexure. Biopsied.                           - Two small polyps in the sigmoid colon and at the                            splenic  flexure, removed with a cold snare.                            Resected and retrieved.                           - One small polyp at the splenic flexure. Biopsied.                           - Diverticulosis in the sigmoid colon. Moderate Sedation:      Moderate (conscious) sedation was administered by the endoscopy nurse       and supervised by the endoscopist. The following parameters were       monitored: oxygen saturation, heart rate, blood pressure, CO2       capnography and response to care. Total physician intraservice time was       30 minutes. Recommendation:           - Patient has a contact number available for                            emergencies. The signs and symptoms of potential                            delayed complications were discussed with the                            patient. Return to normal activities tomorrow.                            Written discharge instructions were provided to the                            patient.                           - High fiber diet today.                           - Continue present medications.                           -  No aspirin, ibuprofen, naproxen, or other                            non-steroidal anti-inflammatory drugs for 1 day.                           - Await pathology results.                           - Repeat colonoscopy is recommended. The                            colonoscopy date will be determined after pathology                            results from today's exam become available for                            review. Procedure Code(s):        --- Professional ---                           [id number], Colonoscopy, flexible; with removal of                            tumor(s), polyp(s), or other lesion(s) by snare                            technique                           45380, 59, Colonoscopy, flexible; with biopsy,                            single or multiple                           99153,  Moderate sedation; each additional 15                            minutes intraservice time                           G0500, Moderate sedation services provided by the                            same physician or other qualified health care                            professional performing a gastrointestinal                            endoscopic service that sedation supports,                            requiring the presence of an independent trained  observer to assist in the monitoring of the                            patient's level of consciousness and physiological                            status; initial 15 minutes of intra-service time;                            patient age 64 years or older (additional time may                            be reported with [phone], as appropriate) Diagnosis Code(s):        --- Professional ---                           D12.3, Benign neoplasm of transverse colon (hepatic                            flexure or splenic flexure)                           D12.5, Benign neoplasm of sigmoid colon                           R19.5, Other fecal abnormalities                           K57.30, Diverticulosis of large intestine without                            perforation or abscess without bleeding CPT copyright 2018 American Medical Association. All rights reserved. The codes documented in this report are preliminary and upon coder review may  be revised to meet current compliance requirements. Hildred Laser, MD Hildred Laser, MD 09/23/2018 11:13:31 AM This report has been signed electronically. Number of Addenda: 0

## 2018-09-25 ENCOUNTER — Encounter (HOSPITAL_COMMUNITY): Payer: Self-pay | Admitting: Internal Medicine

## 2020-03-26 ENCOUNTER — Other Ambulatory Visit (HOSPITAL_COMMUNITY): Payer: Self-pay | Admitting: Internal Medicine

## 2020-03-26 DIAGNOSIS — R748 Abnormal levels of other serum enzymes: Secondary | ICD-10-CM

## 2020-03-26 DIAGNOSIS — R0602 Shortness of breath: Secondary | ICD-10-CM

## 2020-07-22 ENCOUNTER — Ambulatory Visit: Payer: BLUE CROSS/BLUE SHIELD | Admitting: Urology

## 2020-11-08 ENCOUNTER — Other Ambulatory Visit (HOSPITAL_COMMUNITY): Payer: Self-pay | Admitting: Internal Medicine

## 2020-11-08 ENCOUNTER — Other Ambulatory Visit: Payer: Self-pay | Admitting: Internal Medicine

## 2020-11-08 DIAGNOSIS — R161 Splenomegaly, not elsewhere classified: Secondary | ICD-10-CM

## 2020-11-15 ENCOUNTER — Ambulatory Visit (HOSPITAL_COMMUNITY): Payer: BLUE CROSS/BLUE SHIELD

## 2020-11-15 ENCOUNTER — Encounter (HOSPITAL_COMMUNITY): Payer: Self-pay

## 2020-12-29 ENCOUNTER — Other Ambulatory Visit: Payer: Self-pay

## 2020-12-29 ENCOUNTER — Ambulatory Visit: Payer: 59

## 2020-12-29 ENCOUNTER — Ambulatory Visit: Payer: 59 | Admitting: Orthopedic Surgery

## 2020-12-29 ENCOUNTER — Encounter: Payer: Self-pay | Admitting: Orthopedic Surgery

## 2020-12-29 VITALS — BP 175/82 | HR 95 | Ht 66.0 in | Wt 312.0 lb

## 2020-12-29 DIAGNOSIS — G8929 Other chronic pain: Secondary | ICD-10-CM

## 2020-12-29 DIAGNOSIS — M1711 Unilateral primary osteoarthritis, right knee: Secondary | ICD-10-CM

## 2020-12-29 DIAGNOSIS — M1712 Unilateral primary osteoarthritis, left knee: Secondary | ICD-10-CM

## 2020-12-29 DIAGNOSIS — M170 Bilateral primary osteoarthritis of knee: Secondary | ICD-10-CM | POA: Diagnosis not present

## 2020-12-29 NOTE — Progress Notes (Signed)
New Patient Visit  Assessment: Alexa [name] is a 60 y.o. female with the following: Severe bilateral knee arthritis  Plan: Alexa [name] has severe arthritis in bilateral knees.  We reviewed the radiographs in clinic today, and I outlined the natural progression.  We had an extensive discussion regarding all potential treatment options, including continuing with the current treatment. NSAIDs are the most appropriate medications, and these are available OTC or via prescription.  I have urged them to remain active, and they can continue with activities on their own, or we can refer them to physical therapy.  We can also consider a brace, or compression sleeve. If the pain is severe enough, we can consider a steroid injection.  If their knee pain is affecting their everyday activities, including sleep, knee replacement is a consideration, but we would have to refer them to see my partner Dr. Aline Brochure.  Her BMI is 50 so she is not a good candidate for knee arthroplasty at this time.  After discussing all of these options, the patient has elected to proceed with OTC pain medications and a home exercise program.  If she is interested in pursuing an injection, she will contact the clinic.  We discussed the importance of focusing on her nutrition in an attempt to lose weight, and how this could affect her candidacy for surgery later.  The patient meets the AMA guidelines for Morbid obesity with BMI > 40.  The patient has been counseled on weight loss.    Follow-up: Return if symptoms worsen or fail to improve.  Subjective:  Chief Complaint  Patient presents with  . Knee Pain    Bilat knee pain for over a year, NKI. Pt states she had COVID and was bedridden for 3 months and it has made it worse. Plus she has a history of fluid retention that she takes furosemide for and feels a lot of the fluid is in her knees.     History of Present Illness: Alexa [name] is a 60 y.o. female who has been  referred to clinic today by Allyn Kenner, MD for evaluation of bilateral knee pain.  She has had ongoing knee pain for several years.  It is progressively worsening.  Right knee is worse than the left currently.  No specific injury.  She states her pain has been a lot worse since being diagnosed with COVID earlier this year, which required a greater than 71-month recovery..  Since then, she has had significant fatigue, and has been unable to ambulate consistently.  She has not had injections in her knee.  She has not worked with a Community education officer.   Review of Systems: No fevers or chills No numbness or tingling No chest pain + shortness of breath, fatigue No bowel or bladder dysfunction No GI distress No headaches   Medical History:  Past Medical History:  Diagnosis Date  . Arthritis    back, legs, knees  . Cancer ([place]) 1984   uterine  . History of kidney stones   . Hypothyroidism   . Thyroid disease     Past Surgical History:  Procedure Laterality Date  . ABDOMINAL HYSTERECTOMY    . CARPAL TUNNEL RELEASE Right   . CHOLECYSTECTOMY    . COLONOSCOPY N/A 09/23/2018   Procedure: COLONOSCOPY;  Surgeon: Rogene Houston, MD;  Location: [facility];  Service: Endoscopy;  Laterality: N/A;  10:30  . POLYPECTOMY  09/23/2018   Procedure: POLYPECTOMY;  Surgeon: Rogene Houston, MD;  Location: [facility];  Service: Endoscopy;;  CS and BX  . TONSILLECTOMY      No family history on file. Social History   Tobacco Use  . Smoking status: Former Research scientist (life sciences)  . Smokeless tobacco: Never Used  Vaping Use  . Vaping Use: Never used  Substance Use Topics  . Alcohol use: No  . Drug use: No    Allergies  Allergen Reactions  . Codeine Nausea Only    Current Meds  Medication Sig  . FUROSEMIDE PO Take by mouth as needed. For fluid retention  . levothyroxine (SYNTHROID) 150 MCG tablet Take 150 mcg by mouth daily.    Objective: BP (!) 175/82   Pulse 95   Ht 5\' 6"  (1.676 m)   Wt (!)  312 lb (141.5 kg)   BMI 50.36 kg/m   Physical Exam:  General: Alert and oriented.  No acute distress.  Obese female. Gait: Slow, waddling gait.  Evaluation of bilateral knees demonstrates no effusion.  There is a Baker's cyst to the posterior aspect of her left knee.  She is able to obtain full extension, with flexion beyond 90 degrees with no discomfort.  Severe tenderness to palpation along the medial joint line.  Minimal crepitus is appreciated.  No increased laxity to varus or valgus stress.  Negative Lachman.    IMAGING: I personally ordered and reviewed the following images   X-rays of bilateral knees demonstrates a varus alignment overall.  Complete loss of joint space medially.  Osteophytes present within all 3 compartments.  Subchondral sclerosis within the medial compartment.  Impression: Severe bilateral knee arthritis   New Medications:  No orders of the defined types were placed in this encounter.     Mordecai Rasmussen, MD  12/29/2020 10:41 AM

## 2020-12-29 NOTE — Patient Instructions (Signed)
Knee Exercises  Ask your health care provider which exercises are safe for you. Do exercises exactly as told by your health care provider and adjust them as directed. It is normal to feel mild stretching, pulling, tightness, or discomfort as you do these exercises. Stop right away if you feel sudden pain or your pain gets worse. Do not begin these exercises until told by your health care provider.  Stretching and range-of-motion exercises These exercises warm up your muscles and joints and improve the movement and flexibility of your knee. These exercises also help to relieve pain and swelling.  Knee extension, prone 1. Lie on your abdomen (prone position) on a bed. 2. Place your left / right knee just beyond the edge of the surface so your knee is not on the bed. You can put a towel under your left / right thigh just above your kneecap for comfort. 3. Relax your leg muscles and allow gravity to straighten your knee (extension). You should feel a stretch behind your left / right knee. 4. Hold this position for 10 seconds. 5. Scoot up so your knee is supported between repetitions. Repeat 10 times. Complete this exercise 3-4 times per week.     Knee flexion, active 1. Lie on your back with both legs straight. If this causes back discomfort, bend your left / right knee so your foot is flat on the floor. 2. Slowly slide your left / right heel back toward your buttocks. Stop when you feel a gentle stretch in the front of your knee or thigh (flexion). 3. Hold this position for 10 seconds. 4. Slowly slide your left / right heel back to the starting position. Repeat 10 times. Complete this exercise 3-4 times per week.      Quadriceps stretch, prone 1. Lie on your abdomen on a firm surface, such as a bed or padded floor. 2. Bend your left / right knee and hold your ankle. If you cannot reach your ankle or pant leg, loop a belt around your foot and grab the belt instead. 3. Gently pull your heel  toward your buttocks. Your knee should not slide out to the side. You should feel a stretch in the front of your thigh and knee (quadriceps). 4. Hold this position for 10 seconds. Repeat 10 times. Complete this exercise 3-4 times per week.      Hamstring, supine 1. Lie on your back (supine position). 2. Loop a belt or towel over the ball of your left / right foot. The ball of your foot is on the walking surface, right under your toes. 3. Straighten your left / right knee and slowly pull on the belt to raise your leg until you feel a gentle stretch behind your knee (hamstring). ? Do not let your knee bend while you do this. ? Keep your other leg flat on the floor. 4. Hold this position for 10 seconds. Repeat 10 times. Complete this exercise 3-4 times per week.   Strengthening exercises These exercises build strength and endurance in your knee. Endurance is the ability to use your muscles for a long time, even after they get tired.  Quadriceps, isometric This exercise stretches the muscles in front of your thigh (quadriceps) without moving your knee joint (isometric). 1. Lie on your back with your left / right leg extended and your other knee bent. Put a rolled towel or small pillow under your knee if told by your health care provider. 2. Slowly tense the muscles in the   front of your left / right thigh. You should see your kneecap slide up toward your hip or see increased dimpling just above the knee. This motion will push the back of the knee toward the floor. 3. For 10 seconds, hold the muscle as tight as you can without increasing your pain. 4. Relax the muscles slowly and completely. Repeat 10 times. Complete this exercise 3-4 times per week. .     Straight leg raises This exercise stretches the muscles in front of your thigh (quadriceps) and the muscles that move your hips (hip flexors). 1. Lie on your back with your left / right leg extended and your other knee bent. 2. Tense the  muscles in the front of your left / right thigh. You should see your kneecap slide up or see increased dimpling just above the knee. Your thigh may even shake a bit. 3. Keep these muscles tight as you raise your leg 4-6 inches (10-15 cm) off the floor. Do not let your knee bend. 4. Hold this position for 10 seconds. 5. Keep these muscles tense as you lower your leg. 6. Relax your muscles slowly and completely after each repetition. Repeat 10 times. Complete this exercise 3-4 times per week.  Hamstring, isometric 1. Lie on your back on a firm surface. 2. Bend your left / right knee about 30 degrees. 3. Dig your left / right heel into the surface as if you are trying to pull it toward your buttocks. Tighten the muscles in the back of your thighs (hamstring) to "dig" as hard as you can without increasing any pain. 4. Hold this position for 10 seconds. 5. Release the tension gradually and allow your muscles to relax completely for __________ seconds after each repetition. Repeat 10 times. Complete this exercise 3-4 times per week.  Hamstring curls If told by your health care provider, do this exercise while wearing ankle weights. Begin with 5 lb weights. Then increase the weight by 1 lb (0.5 kg) increments. You can also use an exercise band 1. Lie on your abdomen with your legs straight. 2. Bend your left / right knee as far as you can without feeling pain. Keep your hips flat against the floor. 3. Hold this position for 10 seconds. 4. Slowly lower your leg to the starting position. Repeat 10 times. Complete this exercise 3-4 times per week.      Squats This exercise strengthens the muscles in front of your thigh and knee (quadriceps). 1. Stand in front of a table, with your feet and knees pointing straight ahead. You may rest your hands on the table for balance but not for support. 2. Slowly bend your knees and lower your hips like you are going to sit in a chair. ? Keep your weight over  your heels, not over your toes. ? Keep your lower legs upright so they are parallel with the table legs. ? Do not let your hips go lower than your knees. ? Do not bend lower than told by your health care provider. ? If your knee pain increases, do not bend as low. 3. Hold the squat position for 10 seconds. 4. Slowly push with your legs to return to standing. Do not use your hands to pull yourself to standing. Repeat 10 times. Complete this exercise 3-4 times per week .     Wall slides This exercise strengthens the muscles in front of your thigh and knee (quadriceps). 1. Lean your back against a smooth wall or door,   and walk your feet out 18-24 inches (46-61 cm) from it. 2. Place your feet hip-width apart. 3. Slowly slide down the wall or door until your knees bend 90 degrees. Keep your knees over your heels, not over your toes. Keep your knees in line with your hips. 4. Hold this position for 10 seconds. Repeat 10 times. Complete this exercise 3-4 times per week.      Straight leg raises This exercise strengthens the muscles that rotate the leg at the hip and move it away from your body (hip abductors). 1. Lie on your side with your left / right leg in the top position. Lie so your head, shoulder, knee, and hip line up. You may bend your bottom knee to help you keep your balance. 2. Roll your hips slightly forward so your hips are stacked directly over each other and your left / right knee is facing forward. 3. Leading with your heel, lift your top leg 4-6 inches (10-15 cm). You should feel the muscles in your outer hip lifting. ? Do not let your foot drift forward. ? Do not let your knee roll toward the ceiling. 4. Hold this position for 10 seconds. 5. Slowly return your leg to the starting position. 6. Let your muscles relax completely after each repetition. Repeat 10 times. Complete this exercise 3-4 times per week.      Straight leg raises This exercise stretches the muscles that  move your hips away from the front of the pelvis (hip extensors). 1. Lie on your abdomen on a firm surface. You can put a pillow under your hips if that is more comfortable. 2. Tense the muscles in your buttocks and lift your left / right leg about 4-6 inches (10-15 cm). Keep your knee straight as you lift your leg. 3. Hold this position for 10 seconds. 4. Slowly lower your leg to the starting position. 5. Let your leg relax completely after each repetition. Repeat 10 times. Complete this exercise 3-4 times per week.  

## 2021-02-04 ENCOUNTER — Other Ambulatory Visit: Payer: Self-pay

## 2021-02-04 ENCOUNTER — Ambulatory Visit (INDEPENDENT_AMBULATORY_CARE_PROVIDER_SITE_OTHER): Payer: 59 | Admitting: Orthopedic Surgery

## 2021-02-04 ENCOUNTER — Encounter: Payer: Self-pay | Admitting: Orthopedic Surgery

## 2021-02-04 VITALS — BP 119/73 | HR 71 | Ht 66.0 in | Wt 320.0 lb

## 2021-02-04 DIAGNOSIS — M170 Bilateral primary osteoarthritis of knee: Secondary | ICD-10-CM

## 2021-02-04 DIAGNOSIS — Z6841 Body Mass Index (BMI) 40.0 and over, adult: Secondary | ICD-10-CM

## 2021-02-04 DIAGNOSIS — M1711 Unilateral primary osteoarthritis, right knee: Secondary | ICD-10-CM

## 2021-02-04 DIAGNOSIS — M1712 Unilateral primary osteoarthritis, left knee: Secondary | ICD-10-CM

## 2021-02-04 NOTE — Progress Notes (Signed)
Orthopaedic Clinic Return  Assessment: Alexa [name] is a 60 y.o. female with the following: Severe bilateral knee arthritis  Plan: Patient had minimal improvement with prescription NSAIDs, and home exercises.  She did not improve relief with the topical spray.  Given the extent of her symptoms, and limited improvement with other nonoperative treatments, I recommended bilateral knee steroid injections.  She states that she has a trip planned within the next 1-2 weeks, and is interested in proceeding with injections.  All questions were answered and she is amenable to plan.  Unfortunately, her BMI is greater than 50, so she would not be an appropriate candidate for total knee arthroplasty.  Procedure note injection Right knee joint   Verbal consent was obtained to inject the right knee joint  Timeout was completed to confirm the site of injection.  The skin was prepped with alcohol and ethyl chloride was sprayed at the injection site.  A 21-gauge needle was used to inject 40 mg of Depo-Medrol and 1% lidocaine (3 cc) into the right knee using an anterolateral approach.  There were no complications. A sterile bandage was applied.   Procedure note injection Left knee joint   Verbal consent was obtained to inject the left knee joint  Timeout was completed to confirm the site of injection.  The skin was prepped with alcohol and ethyl chloride was sprayed at the injection site.  A 21-gauge needle was used to inject 40 mg of Depo-Medrol and 1% lidocaine (3 cc) into the left knee using an anterolateral approach.  There were no complications. A sterile bandage was applied.   Body mass index is 51.65 kg/m.  Follow-up: No follow-ups on file.   Subjective:  Chief Complaint  Patient presents with  . Knee Pain    Bilateral/right is worse than the left/request injections    History of Present Illness: Alexa [name] is a 60 y.o. female who returns to clinic today for repeat evaluation of  bilateral knee pain.  At the last clinic visit, we discussed multiple options.  She elected to proceed with medications and home exercises.  She notes some improvement in her symptoms, but overall, she has severe pain in bilateral knees.  Currently, her right knee is bothering her more than her left.  No change in her health since the last visit.  She is scheduled go to the beach next week, and would like to be able to enjoy herself.  Review of Systems: No fevers or chills No numbness or tingling No chest pain No shortness of breath No bowel or bladder dysfunction No GI distress No headaches    Objective: BP 119/73   Pulse 71   Ht 5\' 6"  (1.676 m)   Wt (!) 320 lb (145.2 kg)   BMI 51.65 kg/m   Physical Exam:  Alert and oriented.  No acute distress.  Obese female.  Slow, waddling gait.  No assistive device  Evaluation of bilateral knees demonstrates varus overall alignment.  Tenderness to palpation on the medial joint lines.  Range of motion restricted from 5 to 110 degrees.  No obvious laxity to varus or valgus stress.  She is able to maintain a straight leg raise.  Sensation is intact distally.  IMAGING: I personally ordered and reviewed the following images:  X-rays from previous clinic visit were reviewed in clinic today, these demonstrate varus alignment of bilateral knees, with severe, bone-on-bone articulations, specifically within the medial compartments, as well as patellofemoral compartments.  Mordecai Rasmussen, MD  02/04/2021 12:27 PM

## 2021-02-04 NOTE — Patient Instructions (Signed)

## 2021-04-05 DIAGNOSIS — I10 Essential (primary) hypertension: Secondary | ICD-10-CM | POA: Insufficient documentation

## 2021-04-05 DIAGNOSIS — E039 Hypothyroidism, unspecified: Secondary | ICD-10-CM | POA: Insufficient documentation

## 2021-04-12 DIAGNOSIS — R161 Splenomegaly, not elsewhere classified: Secondary | ICD-10-CM | POA: Insufficient documentation

## 2021-04-12 DIAGNOSIS — R809 Proteinuria, unspecified: Secondary | ICD-10-CM | POA: Insufficient documentation

## 2021-04-12 DIAGNOSIS — M25562 Pain in left knee: Secondary | ICD-10-CM | POA: Insufficient documentation

## 2021-04-12 DIAGNOSIS — K760 Fatty (change of) liver, not elsewhere classified: Secondary | ICD-10-CM | POA: Insufficient documentation

## 2021-04-12 DIAGNOSIS — R0600 Dyspnea, unspecified: Secondary | ICD-10-CM | POA: Insufficient documentation

## 2021-09-06 DIAGNOSIS — R7301 Impaired fasting glucose: Secondary | ICD-10-CM | POA: Diagnosis not present

## 2021-09-06 DIAGNOSIS — I10 Essential (primary) hypertension: Secondary | ICD-10-CM | POA: Diagnosis not present

## 2021-09-06 DIAGNOSIS — E559 Vitamin D deficiency, unspecified: Secondary | ICD-10-CM | POA: Diagnosis not present

## 2021-09-06 DIAGNOSIS — E039 Hypothyroidism, unspecified: Secondary | ICD-10-CM | POA: Diagnosis not present

## 2021-09-10 DIAGNOSIS — R42 Dizziness and giddiness: Secondary | ICD-10-CM | POA: Insufficient documentation

## 2021-09-27 ENCOUNTER — Encounter: Payer: Self-pay | Admitting: Urology

## 2021-09-27 ENCOUNTER — Other Ambulatory Visit: Payer: Self-pay

## 2021-09-27 ENCOUNTER — Ambulatory Visit (INDEPENDENT_AMBULATORY_CARE_PROVIDER_SITE_OTHER): Payer: 59 | Admitting: Urology

## 2021-09-27 VITALS — BP 155/77 | HR 72 | Ht 67.0 in | Wt 300.0 lb

## 2021-09-27 DIAGNOSIS — R8290 Unspecified abnormal findings in urine: Secondary | ICD-10-CM | POA: Diagnosis not present

## 2021-09-27 DIAGNOSIS — R310 Gross hematuria: Secondary | ICD-10-CM | POA: Insufficient documentation

## 2021-09-27 DIAGNOSIS — N200 Calculus of kidney: Secondary | ICD-10-CM | POA: Diagnosis not present

## 2021-09-27 LAB — URINALYSIS, ROUTINE W REFLEX MICROSCOPIC
Bilirubin, UA: NEGATIVE
Glucose, UA: NEGATIVE
Ketones, UA: NEGATIVE
Leukocytes,UA: NEGATIVE
Nitrite, UA: NEGATIVE
Specific Gravity, UA: >1.03 — ABNORMAL HIGH (ref 1.005–1.030)
Urobilinogen, Ur: 0.2 mg/dL (ref 0.2–1.0)
pH, UA: 5 (ref 5.0–7.5)

## 2021-09-27 LAB — MICROSCOPIC EXAMINATION
RBC, Urine: >30 /hpf — AB (ref 0–2)
Renal Epithel, UA: NONE SEEN /hpf

## 2021-09-27 NOTE — Progress Notes (Signed)
Urological Symptom Review  Patient is experiencing the following symptoms: Get up at night to urinate Blood in urine Urinary tract infection   Review of Systems  Gastrointestinal (upper)  : Negative for upper GI symptoms  Gastrointestinal (lower) : Negative for lower GI symptoms  Constitutional : Negative for symptoms  Skin: Negative for skin symptoms  Eyes: Negative for eye symptoms  Ear/Nose/Throat : Negative for Ear/Nose/Throat symptoms  Hematologic/Lymphatic: Negative for Hematologic/Lymphatic symptoms  Cardiovascular : Negative for cardiovascular symptoms  Respiratory : Shortness of breath  Endocrine: Negative for endocrine symptoms  Musculoskeletal: Back pain Joint pain  Neurological: Negative for neurological symptoms  Psychologic: Negative for psychiatric symptoms

## 2021-09-27 NOTE — Progress Notes (Signed)
Assessment: 1. Gross hematuria   2. Nephrolithiasis   3. Abnormal urine findings      Plan: I reviewed the patient's records from her PCP.  I also reviewed the prior CT study from 8/16 showing a right UPJ calculus. Urine culture sent today.  Will contact her with results and recommendations. Today I had a discussion with the patient regarding the findings of gross hematuria including the implications and differential diagnoses associated with it.  I also discussed recommendations for further evaluation including the rationale for upper tract imaging and cystoscopy.  I discussed the nature of these procedures including potential risk and complications.  The patient expressed an understanding of these issues. Schedule for CT hematuria protocol and cystoscopy   Chief Complaint:  Chief Complaint  Patient presents with   Hematuria    History of Present Illness:   Alexa [name] is a 61 y.o. year old female who is seen in consultation from Valentino Nose, [place] or evaluation of gross hematuria.  She has a history of intermittent episodes of gross hematuria for the past 18 months.  These episodes typically last approximately 1 week and resolve spontaneously.  The last episode occurred about 1 week ago.  She does not have any dysuria or flank pain associated with these episodes. Dipstick urinalysis from 09/09/2021 demonstrated large blood, nitrite positive.  No urine culture results available.  She was treated with Cipro which she took for 2 days then discontinued.  She reports being treated for 5 UTIs over the past 18 months.  These were based on urinalysis results.  No prior history of UTIs. Creatinine 0.89 from 09/09/2021. She has nocturia times and occasional urgency.  She has a history of nephrolithiasis.  CT imaging from 8/16 demonstrated a 3 mm right UPJ calculus with associated right hydronephrosis and a tiny calculus in the right renal pelvis.  This was her last stone episode.  No recent  imaging has been performed.  She has a prior history of tobacco use smoking less than 1 pack/day for approximately 20 years.  She quit 30 years ago.  No other toxic exposures.  She is a school bus driver.  Past Medical History:  Past Medical History:  Diagnosis Date   Arthritis    back, legs, knees   Cancer ([place]) 1984   uterine   History of kidney stones    Hypothyroidism    Thyroid disease     Past Surgical History:  Past Surgical History:  Procedure Laterality Date   ABDOMINAL HYSTERECTOMY     CARPAL TUNNEL RELEASE Right    CHOLECYSTECTOMY     COLONOSCOPY N/A 09/23/2018   Procedure: COLONOSCOPY;  Surgeon: Rogene Houston, MD;  [facility];  Service: Endoscopy;  Laterality: N/A;  10:30   POLYPECTOMY  09/23/2018   Procedure: POLYPECTOMY;  Surgeon: Rogene Houston, MD;  [facility];  Service: Endoscopy;;  CS and BX   TONSILLECTOMY      Allergies:  Allergies  Allergen Reactions   Codeine Nausea Only    Family History:  No family history on file.  Social History:  Social History   Tobacco Use   Smoking status: Former   Smokeless tobacco: Never  Scientific laboratory technician Use: Never used  Substance Use Topics   Alcohol use: No   Drug use: No    Review of symptoms:  Constitutional:  Negative for unexplained weight loss, night sweats, fever, chills ENT:  Negative for nose bleeds, sinus  pain, painful swallowing CV:  Negative for chest pain, shortness of breath, exercise intolerance, palpitations, loss of consciousness Resp:  Negative for cough, wheezing, shortness of breath GI:  Negative for nausea, vomiting, diarrhea, bloody stools GU:  Positives noted in HPI; otherwise negative for dysuria, urinary incontinence Neuro:  Negative for seizures, poor balance, limb weakness, slurred speech Psych:  Negative for lack of energy, depression, anxiety Endocrine:  Negative for polydipsia, polyuria, symptoms of hypoglycemia (dizziness, hunger,  sweating) Hematologic:  Negative for anemia, purpura, petechia, prolonged or excessive bleeding, use of anticoagulants  Allergic:  Negative for difficulty breathing or choking as a result of exposure to anything; no shellfish allergy; no allergic response (rash/itch) to materials, foods  Physical exam: BP (!) 155/77 (BP Location: Right Arm)    Pulse 72    Ht 5\' 7"  (1.702 m)    Wt 300 lb (136.1 kg)    BMI 46.99 kg/m  GENERAL APPEARANCE:  Well appearing, well developed, well nourished, NAD HEENT: Atraumatic, Normocephalic, oropharynx clear. NECK: Supple without lymphadenopathy or thyromegaly. LUNGS: Clear to auscultation bilaterally. HEART: Regular Rate and Rhythm without murmurs, gallops, or rubs. ABDOMEN: Soft, non-tender, No Masses. EXTREMITIES: Moves all extremities well.  Without clubbing, cyanosis, or edema. NEUROLOGIC:  Alert and oriented x 3, normal gait, CN II-XII grossly intact.  MENTAL STATUS:  Appropriate. BACK:  Non-tender to palpation.  No CVAT SKIN:  Warm, dry and intact.    Results: U/A:  >30 RBC, many bacteria

## 2021-09-29 LAB — URINE CULTURE

## 2021-09-29 MED ORDER — NITROFURANTOIN MONOHYD MACRO 100 MG PO CAPS: 100.0000 mg | ORAL_CAPSULE | Freq: Two times a day (BID) | ORAL | 0 refills | Status: DC

## 2021-09-29 NOTE — Addendum Note (Signed)
Addended by: Primus Bravo on: 09/29/2021 04:35 PM   Modules accepted: Orders

## 2021-10-03 ENCOUNTER — Other Ambulatory Visit: Payer: Self-pay

## 2021-10-03 DIAGNOSIS — R8290 Unspecified abnormal findings in urine: Secondary | ICD-10-CM

## 2021-10-03 MED ORDER — NITROFURANTOIN MONOHYD MACRO 100 MG PO CAPS: 100.0000 mg | ORAL_CAPSULE | Freq: Two times a day (BID) | ORAL | 0 refills | Status: AC

## 2021-10-26 ENCOUNTER — Ambulatory Visit
Admission: RE | Admit: 2021-10-26 | Discharge: 2021-10-26 | Disposition: A | Discharge status: Home / Self Care | Payer: 59 | Source: Ambulatory Visit | Attending: Urology | Admitting: Urology

## 2021-10-26 ENCOUNTER — Other Ambulatory Visit: Payer: Self-pay

## 2021-10-26 DIAGNOSIS — K7689 Other specified diseases of liver: Secondary | ICD-10-CM | POA: Diagnosis not present

## 2021-10-26 DIAGNOSIS — K5730 Diverticulosis of large intestine without perforation or abscess without bleeding: Secondary | ICD-10-CM | POA: Diagnosis not present

## 2021-10-26 DIAGNOSIS — R319 Hematuria, unspecified: Secondary | ICD-10-CM | POA: Diagnosis not present

## 2021-10-26 DIAGNOSIS — N200 Calculus of kidney: Secondary | ICD-10-CM | POA: Diagnosis not present

## 2021-10-26 DIAGNOSIS — R310 Gross hematuria: Secondary | ICD-10-CM

## 2021-10-26 MED ORDER — IOPAMIDOL (ISOVUE-300) INJECTION 61%
100.0000 mL | Freq: Once | INTRAVENOUS | Status: AC | PRN
  Administered 2021-10-26: 100 mL via INTRAVENOUS

## 2021-10-27 ENCOUNTER — Other Ambulatory Visit: Payer: 59 | Admitting: Urology

## 2021-10-27 ENCOUNTER — Telehealth: Payer: Self-pay

## 2021-10-27 NOTE — Progress Notes (Unsigned)
Assessment: 1. Gross hematuria   2. Nephrolithiasis       Plan: I reviewed the patient's records from her PCP.  I also reviewed the prior CT study from 8/16 showing a right UPJ calculus. Urine culture sent today.  Will contact her with results and recommendations. Today I had a discussion with the patient regarding the findings of gross hematuria including the implications and differential diagnoses associated with it.  I also discussed recommendations for further evaluation including the rationale for upper tract imaging and cystoscopy.  I discussed the nature of these procedures including potential risk and complications.  The patient expressed an understanding of these issues. Schedule for CT hematuria protocol and cystoscopy   Chief Complaint:  No chief complaint on file.   History of Present Illness:   Alexa [name] is a 61 y.o. year old female who is seen in consultation from Valentino Nose, [place] or evaluation of gross hematuria.  She has a history of intermittent episodes of gross hematuria for the past 18 months.  These episodes typically last approximately 1 week and resolve spontaneously.  The last episode occurred about 1 week ago.  She does not have any dysuria or flank pain associated with these episodes. Dipstick urinalysis from 09/09/2021 demonstrated large blood, nitrite positive.  No urine culture results available.  She was treated with Cipro which she took for 2 days then discontinued.  She reports being treated for 5 UTIs over the past 18 months.  These were based on urinalysis results.  No prior history of UTIs. Creatinine 0.89 from 09/09/2021. She has nocturia times and occasional urgency.  She has a history of nephrolithiasis.  CT imaging from 8/16 demonstrated a 3 mm right UPJ calculus with associated right hydronephrosis and a tiny calculus in the right renal pelvis.  This was her last stone episode.  No recent imaging has been performed.  She has a prior history of  tobacco use smoking less than 1 pack/day for approximately 20 years.  She quit 30 years ago.  No other toxic exposures.  She is a school bus driver.  She presents today for further evaluation with cystoscopy. CT hematuria protocol from 10/26/2021 showed a 11 mm calculus in the right renal pelvis without obstruction.  Portions of the above documentation were copied from a prior visit for review purposes only.   Past Medical History:  Past Medical History:  Diagnosis Date   Arthritis    back, legs, knees   Cancer ([place]) 1984   uterine   History of kidney stones    Hypothyroidism    Thyroid disease     Past Surgical History:  Past Surgical History:  Procedure Laterality Date   ABDOMINAL HYSTERECTOMY     CARPAL TUNNEL RELEASE Right    CHOLECYSTECTOMY     COLONOSCOPY N/A 09/23/2018   Procedure: COLONOSCOPY;  Surgeon: Rogene Houston, MD;  [facility];  Service: Endoscopy;  Laterality: N/A;  10:30   POLYPECTOMY  09/23/2018   Procedure: POLYPECTOMY;  Surgeon: Rogene Houston, MD;  [facility];  Service: Endoscopy;;  CS and BX   TONSILLECTOMY      Allergies:  Allergies  Allergen Reactions   Codeine Nausea Only    Family History:  No family history on file.  Social History:  Social History   Tobacco Use   Smoking status: Former   Smokeless tobacco: Never  Scientific laboratory technician Use: Never used  Substance Use Topics  Alcohol use: No   Drug use: No    ROS: Constitutional:  Negative for fever, chills, weight loss CV: Negative for chest pain, previous MI, hypertension Respiratory:  Negative for shortness of breath, wheezing, sleep apnea, frequent cough GI:  Negative for nausea, vomiting, bloody stool, GERD  Physical exam: There were no vitals taken for this visit. ***  Results: U/A:    Procedure:  Flexible Cystourethroscopy  Pre-operative Diagnosis: {cysto diagnosis:26394}  Post-operative Diagnosis: {cysto diagnosis:26394}  Anesthesia:   local with lidocaine jelly  Surgical Narrative:  After appropriate informed consent was obtained, the patient was prepped and draped in the usual sterile fashion in the supine position.  The patient was correctly identified and the proper procedure delineated prior to proceeding.  Sterile lidocaine gel was instilled in the urethra. The flexible cystoscope was introduced without difficulty.  Findings:  Urethra: {anterior urethral findings:26395}  Bladder: {bladder findings:26397}  Ureteral orifices: {Normal/Abnormal Appearance:21344::"normal"}  Additional findings:  Saline bladder wash for cytology {WAS/WAS NOT:[phone]::"was not"} performed.    The cystoscope was then removed.  The patient tolerated the procedure well.

## 2021-10-27 NOTE — Telephone Encounter (Signed)
Patient cancelled her CYSTO appt for today, due to her insurance will not cover the visit. She also had a CT done and wanted to know if  the doctor could see what he needs from that.  Please advise.  Thanks, Helene Kelp

## 2021-10-27 NOTE — Telephone Encounter (Signed)
See below

## 2021-11-01 ENCOUNTER — Encounter: Payer: Self-pay | Admitting: Urology

## 2021-11-11 ENCOUNTER — Other Ambulatory Visit: Payer: Self-pay

## 2021-11-11 ENCOUNTER — Ambulatory Visit (INDEPENDENT_AMBULATORY_CARE_PROVIDER_SITE_OTHER): Payer: 59 | Admitting: Urology

## 2021-11-11 ENCOUNTER — Encounter: Payer: Self-pay | Admitting: Urology

## 2021-11-11 VITALS — BP 180/91 | HR 82 | Ht 67.0 in | Wt 300.0 lb

## 2021-11-11 DIAGNOSIS — N200 Calculus of kidney: Secondary | ICD-10-CM | POA: Diagnosis not present

## 2021-11-11 DIAGNOSIS — R310 Gross hematuria: Secondary | ICD-10-CM | POA: Diagnosis not present

## 2021-11-11 DIAGNOSIS — R8290 Unspecified abnormal findings in urine: Secondary | ICD-10-CM

## 2021-11-11 DIAGNOSIS — Z87440 Personal history of urinary (tract) infections: Secondary | ICD-10-CM | POA: Diagnosis not present

## 2021-11-11 LAB — URINALYSIS, ROUTINE W REFLEX MICROSCOPIC
Bilirubin, UA: NEGATIVE
Glucose, UA: NEGATIVE
Nitrite, UA: POSITIVE — AB
Specific Gravity, UA: >1.03 — ABNORMAL HIGH (ref 1.005–1.030)
Urobilinogen, Ur: 0.2 mg/dL (ref 0.2–1.0)
pH, UA: 5.5 (ref 5.0–7.5)

## 2021-11-11 LAB — MICROSCOPIC EXAMINATION
RBC, Urine: >30 /hpf — AB (ref 0–2)
Renal Epithel, UA: NONE SEEN /hpf

## 2021-11-11 MED ORDER — CIPROFLOXACIN HCL 500 MG PO TABS: 500.0000 mg | ORAL_TABLET | Freq: Once | ORAL | Status: DC

## 2021-11-11 MED ORDER — SULFAMETHOXAZOLE-TRIMETHOPRIM 800-160 MG PO TABS: 1.0000 | ORAL_TABLET | Freq: Two times a day (BID) | ORAL | 0 refills | Status: AC

## 2021-11-11 NOTE — Progress Notes (Signed)
? ?Assessment: ?1. History of UTI   ?2. Gross hematuria   ?3. Nephrolithiasis   ?4. Abnormal urine findings   ? ? ?  ?Plan: ?We will postpone cystoscopy today due to abnormal urinalysis. ?Urine culture sent today. ?Begin Bactrim DS twice daily x7 days. ?Reschedule cystoscopy for 2 weeks. ?I personally reviewed the CT scan from 10/26/2021 showing right nephrolithiasis.  I discussed these findings with the patient in detail today.  Options for management including shockwave lithotripsy, ureteroscopic laser lithotripsy, or PCNL discussed.  She is interested in shockwave lithotripsy.  I recommended that we make arrangements for this after we have completed her hematuria evaluation. ? ?Chief Complaint:  ?Chief Complaint  ?Patient presents with  ? Hematuria  ? ? ?History of Present Illness:  ? ?Alexa [name] is a 61 y.o. year old female who is seen for further evaluation of gross hematuria.  She has a history of intermittent episodes of gross hematuria for the past 18 months.  These episodes typically last approximately 1 week and resolve spontaneously. She does not have any dysuria or flank pain associated with these episodes. ?Dipstick urinalysis from 09/09/2021 demonstrated large blood, nitrite positive.  No urine culture results available.  She was treated with Cipro which she took for 2 days then discontinued.  She reports being treated for 5 UTIs over the past 18 months.  These were based on urinalysis results.  No prior history of UTIs. ?Creatinine 0.89 from 09/09/2021. ?She has nocturia times and occasional urgency. ? ?She has a history of nephrolithiasis.  CT imaging from 8/16 demonstrated a 3 mm right UPJ calculus with associated right hydronephrosis and a tiny calculus in the right renal pelvis.  This was her last stone episode.  No recent imaging has been performed. ? ?She has a prior history of tobacco use smoking less than 1 pack/day for approximately 20 years.  She quit 30 years ago.  No other toxic  exposures.  She is a school bus driver. ? ?Urine culture from 09/26/2021 grew >100 K E. coli.  She was treated with Macrobid. ?CT hematuria protocol from 10/26/2021 showed a 13 x 8 mm calculus in the right renal pelvis and an additional 2-3 mm calculus in the interpolar right kidney without obstruction. ? ?She presents today for further evaluation with cystoscopy. ?She noted onset of gross hematuria several days ago.  She has had some intermittent right-sided flank pain.  No fevers, chills, nausea, or vomiting. ? ?Portions of the above documentation were copied from a prior visit for review purposes only. ? ? ?Past Medical History:  ?Past Medical History:  ?Diagnosis Date  ? Arthritis   ? back, legs, knees  ? Cancer [facility]) 1984  ? uterine  ? History of kidney stones   ? Hypothyroidism   ? Thyroid disease   ? ? ?Past Surgical History:  ?Past Surgical History:  ?Procedure Laterality Date  ? ABDOMINAL HYSTERECTOMY    ? CARPAL TUNNEL RELEASE Right   ? CHOLECYSTECTOMY    ? COLONOSCOPY N/A 09/23/2018  ? Procedure: COLONOSCOPY;  Surgeon: Rogene Houston, MD;  [facility];  Service: Endoscopy;  Laterality: N/A;  10:30  ? POLYPECTOMY  09/23/2018  ? Procedure: POLYPECTOMY;  Surgeon: Rogene Houston, MD;  [facility];  Service: Endoscopy;;  CS and BX  ? TONSILLECTOMY    ? ? ?Allergies:  ?Allergies  ?Allergen Reactions  ? Codeine Nausea Only  ? ? ?Family History:  ?History reviewed. No pertinent family history. ? ?  Social History:  ?Social History  ? ?Tobacco Use  ? Smoking status: Former  ? Smokeless tobacco: Never  ?Vaping Use  ? Vaping Use: Never used  ?Substance Use Topics  ? Alcohol use: No  ? Drug use: No  ? ? ?ROS: ?Constitutional:  Negative for fever, chills, weight loss ?CV: Negative for chest pain, previous MI, hypertension ?Respiratory:  Negative for shortness of breath, wheezing, sleep apnea, frequent cough ?GI:  Negative for nausea, vomiting, bloody stool, GERD ? ?Physical exam: ?BP (!) 180/91    Pulse 82   Ht '5\' 7"'$  (1.702 m)   Wt 300 lb (136.1 kg)   BMI 46.99 kg/m?  ?GENERAL APPEARANCE:  Well appearing, well developed, well nourished, NAD ?HEENT:  Atraumatic, normocephalic, oropharynx clear ?NECK:  Supple without lymphadenopathy or thyromegaly ?ABDOMEN:  Soft, non-tender, no masses ?EXTREMITIES:  Moves all extremities well, without clubbing, cyanosis, or edema ?NEUROLOGIC:  Alert and oriented x 3, normal gait, CN II-XII grossly intact ?MENTAL STATUS:  appropriate ?BACK:  Non-tender to palpation, No CVAT ?SKIN:  Warm, dry, and intact ? ? ?Results: ?U/A: 11-30 WBCs, >30 RBCs, many bacteria, nitrite positive ? ?

## 2021-11-14 ENCOUNTER — Telehealth: Payer: Self-pay

## 2021-11-14 LAB — URINE CULTURE

## 2021-11-14 MED ORDER — NITROFURANTOIN MONOHYD MACRO 100 MG PO CAPS: 100.0000 mg | ORAL_CAPSULE | Freq: Every day | ORAL | 2 refills | Status: DC

## 2021-11-14 NOTE — Addendum Note (Signed)
Addended by: Primus Bravo on: 11/14/2021 09:45 AM ? ? Modules accepted: Orders ? ?

## 2021-11-14 NOTE — Telephone Encounter (Signed)
Message that my chart message was sent and to notify office of any questions or concerns. ?

## 2021-11-14 NOTE — Telephone Encounter (Signed)
-----   Message from Primus Bravo, MD sent at 11/14/2021  9:45 AM EDT ----- ?Please notify patient to complete Bactrim x 7 days for UTI. ?I would like for her to then begin daily Macrobid for UTI prevention.  Rx sent. ?Keep appt for cystoscopy as scheduled. ?

## 2021-11-25 ENCOUNTER — Encounter: Payer: Self-pay | Admitting: Urology

## 2021-11-25 ENCOUNTER — Other Ambulatory Visit: Payer: Self-pay

## 2021-11-25 ENCOUNTER — Ambulatory Visit (INDEPENDENT_AMBULATORY_CARE_PROVIDER_SITE_OTHER): Payer: 59 | Admitting: Urology

## 2021-11-25 VITALS — BP 174/77 | HR 92

## 2021-11-25 DIAGNOSIS — Z87440 Personal history of urinary (tract) infections: Secondary | ICD-10-CM | POA: Diagnosis not present

## 2021-11-25 DIAGNOSIS — R310 Gross hematuria: Secondary | ICD-10-CM | POA: Diagnosis not present

## 2021-11-25 DIAGNOSIS — N200 Calculus of kidney: Secondary | ICD-10-CM

## 2021-11-25 LAB — URINALYSIS, ROUTINE W REFLEX MICROSCOPIC
Bilirubin, UA: NEGATIVE
Glucose, UA: NEGATIVE
Ketones, UA: NEGATIVE
Nitrite, UA: NEGATIVE
Specific Gravity, UA: 1.025 (ref 1.005–1.030)
Urobilinogen, Ur: 0.2 mg/dL (ref 0.2–1.0)
pH, UA: 6.5 (ref 5.0–7.5)

## 2021-11-25 LAB — MICROSCOPIC EXAMINATION
RBC, Urine: >30 /hpf — AB (ref 0–2)
Renal Epithel, UA: NONE SEEN /hpf

## 2021-11-25 MED ORDER — CIPROFLOXACIN HCL 500 MG PO TABS
500.0000 mg | ORAL_TABLET | Freq: Once | ORAL | Status: AC
  Administered 2021-11-25: 500 mg via ORAL

## 2021-11-25 NOTE — Progress Notes (Signed)
? ?Assessment: ?1. Gross hematuria   ?2. Nephrolithiasis   ?3. History of UTI   ? ? ?Plan: ?I discussed the findings of normal cystoscopy with the patient today.  It appears that her hematuria is secondary to the right renal calculus. ?Continue daily Macrobid for UTI prevention. ?We again discussed options for management of her right nephrolithiasis including shockwave lithotripsy, ureteroscopic laser lithotripsy, or PCNL.  She is interested in shockwave lithotripsy.   ? ?Procedure: ?The patient will be scheduled for right ESL at [facility].  Surgical request is placed with the surgery schedulers and will be scheduled at the patient's/family request. Informed consent is given as documented below. ?Anesthesia:  local ? ?The patient does not have sleep apnea, history of MRSA, history of VRE, history of cardiac device requiring special anesthetic needs. ?Patient is stable and considered clear for surgical in an outpatient ambulatory surgery setting as well as patient hospital setting. ? ?Consent for Operation or Procedure: Provider Certification ?I hereby certify that the nature, purpose, benefits, usual and most frequent risks of, and alternatives to, the operation or procedure have been explained to the patient (or person authorized to sign for the patient) either by me as responsible physician or by the provider who is to perform the operation or procedure. Time spent such that the patient/family has had an opportunity to ask questions, and that those questions have been answered. The patient or the patient's representative has been advised that selected tasks may be performed by assistants to the primary health care provider(s). I believe that the patient (or person authorized to sign for the patient) understands what has been explained, and has consented to the operation or procedure. No guarantees were implied or made. ? ? ?Chief Complaint:  ?Chief Complaint  ?Patient presents with  ? Hematuria  ? ? ?History of  Present Illness:  ? ?Alexa [name] is a 61 y.o. year old female who is seen for further evaluation of gross hematuria.  She has a history of intermittent episodes of gross hematuria for the past 18 months.  These episodes typically last approximately 1 week and resolve spontaneously. She does not have any dysuria or flank pain associated with these episodes. ?Dipstick urinalysis from 09/09/2021 demonstrated large blood, nitrite positive.  No urine culture results available.  She was treated with Cipro which she took for 2 days then discontinued.  She reports being treated for 5 UTIs over the past 18 months.  These were based on urinalysis results.  No prior history of UTIs. ?Creatinine 0.89 from 09/09/2021. ?She has nocturia times and occasional urgency. ? ?She has a history of nephrolithiasis.  CT imaging from 8/16 demonstrated a 3 mm right UPJ calculus with associated right hydronephrosis and a tiny calculus in the right renal pelvis.  This was her last stone episode.  No recent imaging has been performed. ? ?She has a prior history of tobacco use smoking less than 1 pack/day for approximately 20 years.  She quit 30 years ago.  No other toxic exposures.  She is a school bus driver. ? ?Urine culture from 09/26/2021 grew >100 K E. coli.  She was treated with Macrobid. ?CT hematuria protocol from 10/26/2021 showed a 13 x 8 mm calculus in the right renal pelvis and an additional 2-3 mm calculus in the interpolar right kidney without obstruction. ?She was scheduled for cystoscopy on 11/11/2021.  She noted onset of gross hematuria several days prior to her visit.  She also had some intermittent right-sided flank  pain.   ?Cystoscopy was postponed due to apparent UTI.  Urine culture grew >100 K E. coli.  She was treated with Bactrim and started on daily Macrobid. ? ?She returns today for cystoscopy for further evaluation of her gross hematuria. ?She has had some intermittent right flank pain.  No fevers, chills, nausea,  vomiting.  No gross hematuria.  She continues on daily Macrobid. ? ?Portions of the above documentation were copied from a prior visit for review purposes only. ? ? ?Past Medical History:  ?Past Medical History:  ?Diagnosis Date  ? Arthritis   ? back, legs, knees  ? 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 History of kidney stones   ? Hypothyroidism   ? Thyroid disease   ? ? ?Past Surgical History:  ?Past Surgical History:  ?Procedure Laterality Date  ? ABDOMINAL HYSTERECTOMY    ? CARPAL TUNNEL RELEASE Right   ? CHOLECYSTECTOMY    ? COLONOSCOPY N/A 09/23/2018  ? Procedure: COLONOSCOPY;  Surgeon: Rogene Houston, MD;  [facility]: [facility];  Service: Endoscopy;  Laterality: N/A;  10:30  ? POLYPECTOMY  09/23/2018  ? Procedure: POLYPECTOMY;  Surgeon: Rogene Houston, MD;  Location: [facility];  Service: Endoscopy;;  CS and BX  ? TONSILLECTOMY    ? ? ?Allergies:  ?Allergies  ?Allergen Reactions  ? Codeine Nausea Only  ? ? ?Family History:  ?No family history on file. ? ?Social History:  ?Social History  ? ?Tobacco Use  ? Smoking status: Former  ? Smokeless tobacco: Never  ?Vaping Use  ? Vaping Use: Never used  ?Substance Use Topics  ? Alcohol use: No  ? Drug use: No  ? ? ?ROS: ?Constitutional:  Negative for fever, chills, weight loss ?CV: Negative for chest pain, previous MI, hypertension ?Respiratory:  Negative for shortness of breath, wheezing, sleep apnea, frequent cough ?GI:  Negative for nausea, vomiting, bloody stool, GERD ? ?Physical exam: ?BP (!) 174/77   Pulse 92  ?GENERAL APPEARANCE:  Well appearing, well developed, well nourished, NAD ?HEENT:  Atraumatic, normocephalic, oropharynx clear ?NECK:  Supple without lymphadenopathy or thyromegaly ?ABDOMEN:  Soft, non-tender, no masses ?EXTREMITIES:  Moves all extremities well, without clubbing, cyanosis, or edema ?NEUROLOGIC:  Alert and oriented x 3, normal gait, CN II-XII grossly intact ?MENTAL STATUS:  appropriate ?BACK:  Non-tender to palpation, No CVAT ?SKIN:   Warm, dry, and intact ? ? ?Results: ?U/A: 11-30 WBC, >30 RBC, few bacteria ? ?Procedure:  Flexible Cystourethroscopy ? ?Pre-operative Diagnosis: Gross hematuria ? ?Post-operative Diagnosis: Gross hematuria ? ?Anesthesia:  local with lidocaine jelly ? ?Surgical Narrative: ? ?After appropriate informed consent was obtained, the patient was prepped and draped in the usual sterile fashion in the supine position.  The patient was correctly identified and the proper procedure delineated prior to proceeding.  Sterile lidocaine gel was instilled in the urethra. ?The flexible cystoscope was introduced without difficulty. ? ?Findings: ? ?Urethra: Normal ? ?Bladder: Normal ? ?Ureteral orifices: normal ? ?Additional findings: none ? ?Saline bladder wash for cytology was not performed.   ? ?The cystoscope was then removed.  The patient tolerated the procedure well. ? ? ? ?

## 2021-11-25 NOTE — Progress Notes (Signed)
I spoke with Ms. Alexa [name]. We have discussed possible surgery dates and 01/10/2022  was agreed upon by all parties due to patient request of date. Patient given information about surgery date, what to expect pre-operatively and [name] operatively.  ?  ?We discussed that a pre-op nurse will be calling to set up the pre-op visit that will take place prior to surgery. Informed patient that our office will communicate any additional care to be provided after surgery.  ?  ?Patients questions or concerns were discussed during our call. Advised to call our office should there be any additional information, questions or concerns that arise. Patient verbalized understanding.   ?

## 2021-11-28 ENCOUNTER — Telehealth: Payer: Self-pay

## 2021-11-28 NOTE — Telephone Encounter (Signed)
Patient said she recently spoke with you and scheduled a procedure for sometime in May with Dr. Felipa Eth.  She is having a scheduling conflict with work.  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  Can you please follow up with patient for rescheduling.  Thank you. ?

## 2021-11-29 ENCOUNTER — Other Ambulatory Visit: Payer: Self-pay

## 2021-11-29 DIAGNOSIS — N200 Calculus of kidney: Secondary | ICD-10-CM

## 2021-11-29 NOTE — Telephone Encounter (Signed)
I spoke with patient- patient wishes to schedule ESWL for June 6th due to scheduling conflicts with her work schedule. ? ? ?I spoke with Ms. Alexa [name]. We have discussed possible surgery dates and 02/07/2022 was agreed upon by all parties. Patient given information about surgery date, what to expect pre-operatively and [name] operatively.  ?  ?We discussed that a pre-op nurse will be calling to set up the pre-op visit that will take place prior to surgery. Informed patient that our office will communicate any additional care to be provided after surgery.  ?  ?Patients questions or concerns were discussed during our call. Advised to call our office should there be any additional information, questions or concerns that arise. Patient verbalized understanding.   ?

## 2022-01-02 ENCOUNTER — Telehealth: Payer: Self-pay

## 2022-01-02 NOTE — Telephone Encounter (Signed)
Patient called and wishes to proceed sooner with ESWL. Patient took 3 Aleve this am. Scheduled for 01/10/2022 ESWL.  ?

## 2022-01-03 DIAGNOSIS — E876 Hypokalemia: Secondary | ICD-10-CM | POA: Insufficient documentation

## 2022-01-03 DIAGNOSIS — N23 Unspecified renal colic: Secondary | ICD-10-CM | POA: Insufficient documentation

## 2022-01-03 DIAGNOSIS — D696 Thrombocytopenia, unspecified: Secondary | ICD-10-CM | POA: Diagnosis not present

## 2022-01-03 DIAGNOSIS — N289 Disorder of kidney and ureter, unspecified: Secondary | ICD-10-CM | POA: Insufficient documentation

## 2022-01-03 DIAGNOSIS — R109 Unspecified abdominal pain: Secondary | ICD-10-CM | POA: Diagnosis not present

## 2022-01-04 ENCOUNTER — Emergency Department (HOSPITAL_COMMUNITY)
Admission: EM | Admit: 2022-01-04 | Discharge: 2022-01-04 | Disposition: A | Discharge status: Home / Self Care | Payer: 59 | Attending: Emergency Medicine | Admitting: Emergency Medicine

## 2022-01-04 ENCOUNTER — Other Ambulatory Visit: Payer: Self-pay

## 2022-01-04 ENCOUNTER — Encounter (HOSPITAL_COMMUNITY): Payer: Self-pay | Admitting: Emergency Medicine

## 2022-01-04 DIAGNOSIS — E876 Hypokalemia: Secondary | ICD-10-CM

## 2022-01-04 DIAGNOSIS — D696 Thrombocytopenia, unspecified: Secondary | ICD-10-CM

## 2022-01-04 DIAGNOSIS — N289 Disorder of kidney and ureter, unspecified: Secondary | ICD-10-CM

## 2022-01-04 DIAGNOSIS — N23 Unspecified renal colic: Secondary | ICD-10-CM

## 2022-01-04 LAB — URINALYSIS, ROUTINE W REFLEX MICROSCOPIC
Bilirubin Urine: NEGATIVE
Glucose, UA: NEGATIVE mg/dL
Ketones, ur: NEGATIVE mg/dL
Nitrite: NEGATIVE
Protein, ur: 100 mg/dL — AB
Specific Gravity, Urine: 1.024 (ref 1.005–1.030)
WBC, UA: >50 WBC/hpf — ABNORMAL HIGH (ref 0–5)
pH: 5 (ref 5.0–8.0)

## 2022-01-04 LAB — CBC WITH DIFFERENTIAL/PLATELET
Abs Immature Granulocytes: 0.21 10*3/uL — ABNORMAL HIGH (ref 0.00–0.07)
Basophils Absolute: 0 10*3/uL (ref 0.0–0.1)
Basophils Relative: 0 %
Eosinophils Absolute: 0.4 10*3/uL (ref 0.0–0.5)
Eosinophils Relative: 5 %
HCT: 37.1 % (ref 36.0–46.0)
Hemoglobin: 12.3 g/dL (ref 12.0–15.0)
Immature Granulocytes: 2 %
Lymphocytes Relative: 4 %
Lymphs Abs: 0.4 10*3/uL — ABNORMAL LOW (ref 0.7–4.0)
MCH: 29 pg (ref 26.0–34.0)
MCHC: 33.2 g/dL (ref 30.0–36.0)
MCV: 87.5 fL (ref 80.0–100.0)
Monocytes Absolute: 0.3 10*3/uL (ref 0.1–1.0)
Monocytes Relative: 3 %
Neutro Abs: 7.9 10*3/uL — ABNORMAL HIGH (ref 1.7–7.7)
Neutrophils Relative %: 86 %
Platelets: 138 10*3/uL — ABNORMAL LOW (ref 150–400)
RBC: 4.24 MIL/uL (ref 3.87–5.11)
RDW: 14.6 % (ref 11.5–15.5)
WBC: 9.3 10*3/uL (ref 4.0–10.5)
nRBC: 0 % (ref 0.0–0.2)

## 2022-01-04 LAB — BASIC METABOLIC PANEL
Anion gap: 11 (ref 5–15)
BUN: 18 mg/dL (ref 8–23)
CO2: 21 mmol/L — ABNORMAL LOW (ref 22–32)
Calcium: 8.6 mg/dL — ABNORMAL LOW (ref 8.9–10.3)
Chloride: 106 mmol/L (ref 98–111)
Creatinine, Ser: 1.39 mg/dL — ABNORMAL HIGH (ref 0.44–1.00)
GFR, Estimated: 43 mL/min — ABNORMAL LOW (ref >60)
Glucose, Bld: 143 mg/dL — ABNORMAL HIGH (ref 70–99)
Potassium: 3.3 mmol/L — ABNORMAL LOW (ref 3.5–5.1)
Sodium: 138 mmol/L (ref 135–145)

## 2022-01-04 MED ORDER — POTASSIUM CHLORIDE CRYS ER 20 MEQ PO TBCR
40.0000 meq | EXTENDED_RELEASE_TABLET | Freq: Once | ORAL | Status: AC
  Administered 2022-01-04: 40 meq via ORAL
  Filled 2022-01-04: qty 2

## 2022-01-04 MED ORDER — ONDANSETRON HCL 4 MG/2ML IJ SOLN
4.0000 mg | Freq: Once | INTRAMUSCULAR | Status: AC
  Administered 2022-01-04: 4 mg via INTRAVENOUS
  Filled 2022-01-04: qty 2

## 2022-01-04 MED ORDER — OXYCODONE-ACETAMINOPHEN 5-325 MG PO TABS: 1.0000 | ORAL_TABLET | ORAL | 0 refills | Status: DC | PRN

## 2022-01-04 MED ORDER — MORPHINE SULFATE (PF) 4 MG/ML IV SOLN
4.0000 mg | Freq: Once | INTRAVENOUS | Status: AC
  Administered 2022-01-04: 4 mg via INTRAVENOUS
  Filled 2022-01-04: qty 1

## 2022-01-04 NOTE — ED Triage Notes (Signed)
Pt c/o right flank pain since Wed. Pt is supposed to have lithotripsy next week.  ?

## 2022-01-04 NOTE — ED Provider Notes (Signed)
?[facility] ?Provider Note ? ? ?CSN: [id number] ?Arrival date & time: 01/03/22  2334 ? ?  ? ?History ? ?Chief Complaint  ?Patient presents with  ? Flank Pain  ? ? ?Alexa [name] is a 61 y.o. female. ? ?The history is provided by the patient.  ?Flank Pain ?\She has history of kidney stones and was scheduled to have lithotripsy done earlier today but had to cancel because she had taken NSAIDs.  She relates she had increased pain which started yesterday and got worse today.  Pain is in the right flank with some radiation of the right lower quadrant.  She denies fever but has had chills and sweats.  She has had nausea but no vomiting.  Of note, she is requesting a not to a CT scan because her insurance will not pay for it. ?  ?Home Medications ?Prior to Admission medications   ?Medication Sig Start Date End Date Taking? Authorizing Provider  ?budesonide-formoterol (SYMBICORT) 80-4.5 MCG/ACT inhaler SMARTSIG:2 Puff(s) By Mouth Twice Daily 09/10/21   [provider]  ?FUROSEMIDE PO Take by mouth as needed. For fluid retention    [provider]  ?levothyroxine (SYNTHROID) 150 MCG tablet Take 150 mcg by mouth daily. 12/14/20   [provider]  ?losartan (COZAAR) 50 MG tablet Take 50 mg by mouth daily. 07/18/21   [provider]  ?meclizine (ANTIVERT) 12.5 MG tablet meclizine 12.5 mg tablet ? TAKE 1 TABLET BY MOUTH TWICE A DAY AS NEEDED    [provider]  ?nitrofurantoin, macrocrystal-monohydrate, (MACROBID) 100 MG capsule Take 1 capsule (100 mg total) by mouth daily. Begin after completing Bactrim. 11/14/21   Stoneking, Reece Leader., MD  ?Semaglutide,0.25 or 0.'5MG'$ /DOS, (OZEMPIC, 0.25 OR 0.5 MG/DOSE,) 2 MG/1.5ML SOPN     [provider]  ?   ? ?Allergies    ?Codeine   ? ?Review of Systems   ?Review of Systems  ?Genitourinary:  Positive for flank pain.  ?All other systems reviewed and are negative. ? ?Physical Exam ?Updated Vital Signs ?BP 131/73   Pulse  (!) 102   Temp 97.8 ?F (36.6 ?C) (Oral)   Resp 18   Ht '5\' 7"'$  (1.702 m)   Wt 136.1 kg   SpO2 96%   BMI 46.99 kg/m?  ?Physical Exam ?Vitals and nursing note reviewed.  ?61 year old female, resting comfortably and in no acute distress. Vital signs are significant for borderline elevated heart rate. Oxygen saturation is 96%, which is normal. ?Head is normocephalic and atraumatic. PERRLA, EOMI. Oropharynx is clear. ?Neck is nontender and supple without adenopathy or JVD. ?Back is nontender in the midline.  There is mild to moderate right CVA tenderness. ?Lungs are clear without rales, wheezes, or rhonchi. ?Chest is nontender. ?Heart has regular rate and rhythm without murmur. ?Abdomen is soft, flat, with mild tenderness in the right lower abdomen.  There is no rebound or guarding. ?Extremities have no cyanosis or edema, full range of motion is present. ?Skin is warm and dry without rash. ?Neurologic: Mental status is normal, cranial nerves are intact, moves all extremities equally. ? ?ED Results / Procedures / Treatments   ?Labs ?(all labs ordered are listed, but only abnormal results are displayed) ?Labs Reviewed  ?CBC WITH DIFFERENTIAL/PLATELET - Abnormal; Notable for the following components:  ?    Result Value  ? Platelets 138 (*)   ? Neutro Abs 7.9 (*)   ? Lymphs Abs 0.4 (*)   ? Abs Immature Granulocytes 0.21 (*)   ?  All other components within normal limits  ?BASIC METABOLIC PANEL - Abnormal; Notable for the following components:  ? Potassium 3.3 (*)   ? CO2 21 (*)   ? Glucose, Bld 143 (*)   ? Creatinine, Ser 1.39 (*)   ? Calcium 8.6 (*)   ? GFR, Estimated 43 (*)   ? All other components within normal limits  ?URINALYSIS, ROUTINE W REFLEX MICROSCOPIC  ? ? ?EKG ?None ? ?Radiology ?No results found. ? ?Procedures ?Procedures  ? ? ?Medications Ordered in ED ?Medications - No data to display ? ?ED Course/ Medical Decision Making/ A&P ?  ?                        ?Medical Decision Making ?Amount and/or Complexity  of Data Reviewed ?Labs: ordered. ? ?Risk ?Prescription drug management. ? ? ?Right-sided kidney stone, now with renal colic.  Old records are reviewed confirming that she was scheduled for left extracorporeal lithotripsy on 5/2, has now been rescheduled for 5/9.  CT scan on 2/24 showed a 13 mm calculus in the right renal pelvis without hydronephrosis.  I have explained to patient that ultrasound is not immediately available and CT be the most appropriate modality to evaluate whether she now is having hydronephrosis, she still does not wish the scan to be done.  With chills and sweats, I am concerned about possible infection.  She is currently taking nitrofurantoin, but will check urinalysis and CBC and metabolic panel to make sure that there is not now a urinary infection which would require emergent urologic consultation. ? ?Labs are reassuring.  There is mild hypokalemia, mild renal insufficiency with baseline renal function unknown, mild thrombocytopenia which is not felt to be clinically significant.  She is given a dose of oral potassium.  She is feeling better following above-noted treatment and is requesting discharge.  She is discharged with a take-home pack of oxycodone-acetaminophen. ? ?Final Clinical Impression(s) / ED Diagnoses ?Final diagnoses:  ?Renal colic on right side  ?Renal insufficiency  ?Hypokalemia  ?Thrombocytopenia ([place])  ? ? ?Rx / DC Orders ?ED Discharge Orders   ? ?      Ordered  ?  oxyCODONE-acetaminophen (PERCOCET) 5-325 MG tablet  Every 4 hours PRN       ? 01/04/22 0503  ? ?  ?  ? ?  ? ? ?  ?Delora Fuel, MD ?70/35/00 [phone] ? ?

## 2022-01-04 NOTE — Discharge Instructions (Addendum)
Return immediately if you start running a fever. ?

## 2022-01-07 ENCOUNTER — Inpatient Hospital Stay (HOSPITAL_COMMUNITY)
Admission: EM | Admit: 2022-01-07 | Discharge: 2022-01-11 | DRG: 853 | Disposition: A | Discharge status: Home / Self Care | Payer: 59 | Attending: Internal Medicine | Admitting: Internal Medicine

## 2022-01-07 ENCOUNTER — Emergency Department (HOSPITAL_COMMUNITY): Payer: 59

## 2022-01-07 ENCOUNTER — Other Ambulatory Visit (HOSPITAL_COMMUNITY): Payer: Self-pay

## 2022-01-07 ENCOUNTER — Other Ambulatory Visit: Payer: Self-pay

## 2022-01-07 ENCOUNTER — Encounter (HOSPITAL_COMMUNITY): Payer: Self-pay

## 2022-01-07 DIAGNOSIS — J45909 Unspecified asthma, uncomplicated: Secondary | ICD-10-CM | POA: Diagnosis present

## 2022-01-07 DIAGNOSIS — D6959 Other secondary thrombocytopenia: Secondary | ICD-10-CM | POA: Diagnosis present

## 2022-01-07 DIAGNOSIS — Z79899 Other long term (current) drug therapy: Secondary | ICD-10-CM

## 2022-01-07 DIAGNOSIS — R531 Weakness: Secondary | ICD-10-CM | POA: Diagnosis not present

## 2022-01-07 DIAGNOSIS — Z1623 Resistance to quinolones and fluoroquinolones: Secondary | ICD-10-CM | POA: Diagnosis present

## 2022-01-07 DIAGNOSIS — A419 Sepsis, unspecified organism: Secondary | ICD-10-CM

## 2022-01-07 DIAGNOSIS — E039 Hypothyroidism, unspecified: Secondary | ICD-10-CM | POA: Diagnosis present

## 2022-01-07 DIAGNOSIS — N3289 Other specified disorders of bladder: Secondary | ICD-10-CM | POA: Diagnosis present

## 2022-01-07 DIAGNOSIS — Z87442 Personal history of urinary calculi: Secondary | ICD-10-CM | POA: Diagnosis present

## 2022-01-07 DIAGNOSIS — I10 Essential (primary) hypertension: Secondary | ICD-10-CM | POA: Diagnosis present

## 2022-01-07 DIAGNOSIS — R0902 Hypoxemia: Secondary | ICD-10-CM | POA: Diagnosis not present

## 2022-01-07 DIAGNOSIS — R6521 Severe sepsis with septic shock: Secondary | ICD-10-CM | POA: Diagnosis present

## 2022-01-07 DIAGNOSIS — N136 Pyonephrosis: Secondary | ICD-10-CM | POA: Diagnosis present

## 2022-01-07 DIAGNOSIS — N200 Calculus of kidney: Secondary | ICD-10-CM | POA: Diagnosis not present

## 2022-01-07 DIAGNOSIS — Z6841 Body Mass Index (BMI) 40.0 and over, adult: Secondary | ICD-10-CM

## 2022-01-07 DIAGNOSIS — R000 Tachycardia, unspecified: Secondary | ICD-10-CM | POA: Diagnosis not present

## 2022-01-07 DIAGNOSIS — N179 Acute kidney failure, unspecified: Secondary | ICD-10-CM | POA: Diagnosis present

## 2022-01-07 DIAGNOSIS — A4151 Sepsis due to Escherichia coli [E. coli]: Principal | ICD-10-CM | POA: Diagnosis present

## 2022-01-07 DIAGNOSIS — Z7951 Long term (current) use of inhaled steroids: Secondary | ICD-10-CM

## 2022-01-07 DIAGNOSIS — Z87891 Personal history of nicotine dependence: Secondary | ICD-10-CM

## 2022-01-07 DIAGNOSIS — Z20822 Contact with and (suspected) exposure to covid-19: Secondary | ICD-10-CM | POA: Diagnosis present

## 2022-01-07 DIAGNOSIS — N23 Unspecified renal colic: Secondary | ICD-10-CM | POA: Diagnosis not present

## 2022-01-07 DIAGNOSIS — Z743 Need for continuous supervision: Secondary | ICD-10-CM | POA: Diagnosis not present

## 2022-01-07 DIAGNOSIS — Z79890 Hormone replacement therapy (postmenopausal): Secondary | ICD-10-CM

## 2022-01-07 DIAGNOSIS — E876 Hypokalemia: Secondary | ICD-10-CM | POA: Diagnosis present

## 2022-01-07 DIAGNOSIS — Z90710 Acquired absence of both cervix and uterus: Secondary | ICD-10-CM

## 2022-01-07 DIAGNOSIS — R52 Pain, unspecified: Secondary | ICD-10-CM | POA: Diagnosis not present

## 2022-01-07 LAB — COMPREHENSIVE METABOLIC PANEL
ALT: 19 U/L (ref 0–44)
AST: 31 U/L (ref 15–41)
Albumin: 2.7 g/dL — ABNORMAL LOW (ref 3.5–5.0)
Alkaline Phosphatase: 295 U/L — ABNORMAL HIGH (ref 38–126)
Anion gap: 10 (ref 5–15)
BUN: 43 mg/dL — ABNORMAL HIGH (ref 8–23)
CO2: 24 mmol/L (ref 22–32)
Calcium: 8.3 mg/dL — ABNORMAL LOW (ref 8.9–10.3)
Chloride: 101 mmol/L (ref 98–111)
Creatinine, Ser: 2.71 mg/dL — ABNORMAL HIGH (ref 0.44–1.00)
GFR, Estimated: 19 mL/min — ABNORMAL LOW (ref >60)
Glucose, Bld: 168 mg/dL — ABNORMAL HIGH (ref 70–99)
Potassium: 3 mmol/L — ABNORMAL LOW (ref 3.5–5.1)
Sodium: 135 mmol/L (ref 135–145)
Total Bilirubin: 1.4 mg/dL — ABNORMAL HIGH (ref 0.3–1.2)
Total Protein: 6.7 g/dL (ref 6.5–8.1)

## 2022-01-07 LAB — CBC WITH DIFFERENTIAL/PLATELET
Abs Immature Granulocytes: 0.76 10*3/uL — ABNORMAL HIGH (ref 0.00–0.07)
Basophils Absolute: 0.1 10*3/uL (ref 0.0–0.1)
Basophils Relative: 1 %
Eosinophils Absolute: 0 10*3/uL (ref 0.0–0.5)
Eosinophils Relative: 0 %
HCT: 35.9 % — ABNORMAL LOW (ref 36.0–46.0)
Hemoglobin: 12 g/dL (ref 12.0–15.0)
Immature Granulocytes: 11 %
Lymphocytes Relative: 3 %
Lymphs Abs: 0.2 10*3/uL — ABNORMAL LOW (ref 0.7–4.0)
MCH: 29 pg (ref 26.0–34.0)
MCHC: 33.4 g/dL (ref 30.0–36.0)
MCV: 86.7 fL (ref 80.0–100.0)
Monocytes Absolute: 0.1 10*3/uL (ref 0.1–1.0)
Monocytes Relative: 1 %
Neutro Abs: 6.1 10*3/uL (ref 1.7–7.7)
Neutrophils Relative %: 84 %
Platelets: 121 10*3/uL — ABNORMAL LOW (ref 150–400)
RBC: 4.14 MIL/uL (ref 3.87–5.11)
RDW: 14.9 % (ref 11.5–15.5)
WBC: 7.1 10*3/uL (ref 4.0–10.5)
nRBC: 0 % (ref 0.0–0.2)

## 2022-01-07 LAB — URINALYSIS, ROUTINE W REFLEX MICROSCOPIC
Bilirubin Urine: NEGATIVE
Glucose, UA: NEGATIVE mg/dL
Ketones, ur: NEGATIVE mg/dL
Nitrite: NEGATIVE
Protein, ur: 100 mg/dL — AB
Specific Gravity, Urine: 1.02 (ref 1.005–1.030)
pH: 6 (ref 5.0–8.0)

## 2022-01-07 LAB — RESP PANEL BY RT-PCR (FLU A&B, COVID) ARPGX2
Influenza A by PCR: NEGATIVE
Influenza B by PCR: NEGATIVE
SARS Coronavirus 2 by RT PCR: NEGATIVE

## 2022-01-07 LAB — URINALYSIS, MICROSCOPIC (REFLEX): WBC, UA: >50 WBC/hpf (ref 0–5)

## 2022-01-07 LAB — APTT: aPTT: 27 seconds (ref 24–36)

## 2022-01-07 LAB — PROTIME-INR
INR: 1.1 (ref 0.8–1.2)
Prothrombin Time: 14.2 seconds (ref 11.4–15.2)

## 2022-01-07 LAB — LACTIC ACID, PLASMA: Lactic Acid, Venous: 3.3 mmol/L (ref 0.5–1.9)

## 2022-01-07 MED ORDER — SODIUM CHLORIDE 0.9 % IV SOLN
2.0000 g | INTRAVENOUS | Status: DC
  Administered 2022-01-07: 2 g via INTRAVENOUS
  Filled 2022-01-07 (×2): qty 20

## 2022-01-07 MED ORDER — SODIUM CHLORIDE 0.9 % IV BOLUS (SEPSIS)
1000.0000 mL | Freq: Once | INTRAVENOUS | Status: AC
  Administered 2022-01-07: 1000 mL via INTRAVENOUS

## 2022-01-07 MED ORDER — SODIUM CHLORIDE 0.9 % IV SOLN: 1000.0000 mL | INTRAVENOUS | Status: DC

## 2022-01-07 MED ORDER — ACETAMINOPHEN 325 MG PO TABS
650.0000 mg | ORAL_TABLET | Freq: Once | ORAL | Status: AC
  Administered 2022-01-07: 650 mg via ORAL
  Filled 2022-01-07: qty 2

## 2022-01-07 MED ORDER — NOREPINEPHRINE 4 MG/250ML-% IV SOLN
INTRAVENOUS | Status: AC
  Administered 2022-01-08: 2 ug/min via INTRAVENOUS
  Filled 2022-01-07: qty 250

## 2022-01-07 MED ORDER — NOREPINEPHRINE 4 MG/250ML-% IV SOLN: 0.0000 ug/min | INTRAVENOUS | Status: DC

## 2022-01-07 MED ORDER — SODIUM CHLORIDE 0.9 % IV BOLUS
3000.0000 mL | Freq: Once | INTRAVENOUS | Status: AC
  Administered 2022-01-07: 3000 mL via INTRAVENOUS

## 2022-01-07 NOTE — ED Provider Notes (Signed)
?[facility] ?Provider Note ? ? ?CSN: [id number] ?Arrival date & time: 01/07/22  2038 ? ?  ? ?History ? ?Chief Complaint  ?Patient presents with  ? renal calculi  ? ? ?Alexa [name] is a 61 y.o. female. ? ?Patient with a history of a 13 mm renal stone.  She was supposed to get lithotripsy last Tuesday but had taken nonsteroidal also had to be delayed till next Tuesday.  She started with fevers and chills last few days and weakness and vomiting.  Patient has a history of thyroid disease and kidney stones ? ?The history is provided by the patient and medical records. No language interpreter was used.  ?Weakness ?Severity:  Moderate ?Onset quality:  Sudden ?Timing:  Constant ?Progression:  Worsening ?Chronicity:  New ?Context: not alcohol use   ?Relieved by:  Nothing ?Worsened by:  Nothing ?Ineffective treatments:  None tried ?Associated symptoms: no abdominal pain, no chest pain, no cough, no diarrhea, no frequency, no headaches and no seizures   ? ?  ? ?Home Medications ?Prior to Admission medications   ?Medication Sig Start Date End Date Taking? Authorizing Provider  ?budesonide-formoterol (SYMBICORT) 80-4.5 MCG/ACT inhaler SMARTSIG:2 Puff(s) By Mouth Twice Daily 09/10/21   [provider]  ?FUROSEMIDE PO Take by mouth as needed. For fluid retention    [provider]  ?levothyroxine (SYNTHROID) 150 MCG tablet Take 150 mcg by mouth daily. 12/14/20   [provider]  ?losartan (COZAAR) 50 MG tablet Take 50 mg by mouth daily. 07/18/21   [provider]  ?meclizine (ANTIVERT) 12.5 MG tablet meclizine 12.5 mg tablet ? TAKE 1 TABLET BY MOUTH TWICE A DAY AS NEEDED    [provider]  ?nitrofurantoin, macrocrystal-monohydrate, (MACROBID) 100 MG capsule Take 1 capsule (100 mg total) by mouth daily. Begin after completing Bactrim. 11/14/21   Stoneking, Reece Leader., MD  ?oxyCODONE-acetaminophen (PERCOCET) 5-325 MG tablet Take 1 tablet by mouth every 4 (four) hours as  needed for moderate pain. 03/05/23   Delora Fuel, MD  ?PYKDXIPJASN,0.53 or 0.'5MG'$ /DOS, (OZEMPIC, 0.25 OR 0.5 MG/DOSE,) 2 MG/1.5ML SOPN     [provider]  ?   ? ?Allergies    ?Codeine   ? ?Review of Systems   ?Review of Systems  ?Constitutional:  Positive for chills. Negative for appetite change and fatigue.  ?HENT:  Negative for congestion, ear discharge and sinus pressure.   ?Eyes:  Negative for discharge.  ?Respiratory:  Negative for cough.   ?Cardiovascular:  Negative for chest pain.  ?Gastrointestinal:  Negative for abdominal pain and diarrhea.  ?Genitourinary:  Negative for frequency and hematuria.  ?Musculoskeletal:  Negative for back pain.  ?Skin:  Negative for rash.  ?Neurological:  Positive for weakness. Negative for seizures and headaches.  ?Psychiatric/Behavioral:  Negative for hallucinations.   ? ?Physical Exam ?Updated Vital Signs ?BP 96/63   Pulse 98   Temp (!) 100.4 ?F (38 ?C)   Resp 14   Ht '5\' 7"'$  (1.702 m)   Wt 136.1 kg   SpO2 100%   BMI 46.99 kg/m?  ?Physical Exam ?Vitals and nursing note reviewed.  ?Constitutional:   ?   Appearance: She is well-developed.  ?HENT:  ?   Head: Normocephalic.  ?   Nose: Nose normal.  ?Eyes:  ?   General: No scleral icterus. ?   Conjunctiva/sclera: Conjunctivae normal.  ?Neck:  ?   Thyroid: No thyromegaly.  ?Cardiovascular:  ?   Rate and Rhythm: Normal rate and regular rhythm.  ?  Heart sounds: No murmur heard. ?  No friction rub. No gallop.  ?Pulmonary:  ?   Breath sounds: No stridor. No wheezing or rales.  ?Chest:  ?   Chest wall: No tenderness.  ?Abdominal:  ?   General: There is no distension.  ?   Tenderness: There is no abdominal tenderness. There is no rebound.  ?Musculoskeletal:     ?   General: Normal range of motion.  ?   Cervical back: Neck supple.  ?Lymphadenopathy:  ?   Cervical: No cervical adenopathy.  ?Skin: ?   Findings: No erythema or rash.  ?Neurological:  ?   Mental Status: She is alert and oriented to person, place, and time.  ?    Motor: No abnormal muscle tone.  ?   Coordination: Coordination normal.  ?Psychiatric:     ?   Behavior: Behavior normal.  ? ? ?ED Results / Procedures / Treatments   ?Labs ?(all labs ordered are listed, but only abnormal results are displayed) ?Labs Reviewed  ?LACTIC ACID, PLASMA - Abnormal; Notable for the following components:  ?    Result Value  ? Lactic Acid, Venous 3.3 (*)   ? All other components within normal limits  ?COMPREHENSIVE METABOLIC PANEL - Abnormal; Notable for the following components:  ? Potassium 3.0 (*)   ? Glucose, Bld 168 (*)   ? BUN 43 (*)   ? Creatinine, Ser 2.71 (*)   ? Calcium 8.3 (*)   ? Albumin 2.7 (*)   ? Alkaline Phosphatase 295 (*)   ? Total Bilirubin 1.4 (*)   ? GFR, Estimated 19 (*)   ? All other components within normal limits  ?CBC WITH DIFFERENTIAL/PLATELET - Abnormal; Notable for the following components:  ? HCT 35.9 (*)   ? Platelets 121 (*)   ? Lymphs Abs 0.2 (*)   ? Abs Immature Granulocytes 0.76 (*)   ? All other components within normal limits  ?URINALYSIS, ROUTINE W REFLEX MICROSCOPIC - Abnormal; Notable for the following components:  ? Hgb urine dipstick LARGE (*)   ? Protein, ur 100 (*)   ? Leukocytes,Ua LARGE (*)   ? All other components within normal limits  ?URINALYSIS, MICROSCOPIC (REFLEX) - Abnormal; Notable for the following components:  ? Bacteria, UA MANY (*)   ? All other components within normal limits  ?RESP PANEL BY RT-PCR (FLU A&B, COVID) [id number]  ?CULTURE, BLOOD (ROUTINE X 2)  ?CULTURE, BLOOD (ROUTINE X 2)  ?URINE CULTURE  ?PROTIME-INR  ?APTT  ?LACTIC ACID, PLASMA  ? ? ?EKG ?None ? ?Radiology ?DG Chest Port 1 View ? ?Result Date: 01/07/2022 ?CLINICAL DATA:  Sepsis EXAM: PORTABLE CHEST 1 VIEW COMPARISON:  None Available. FINDINGS: The heart size and mediastinal contours are within normal limits. Both lungs are clear. The visualized skeletal structures are unremarkable. IMPRESSION: No active disease. Electronically Signed   By: Ulyses Jarred M.D.   On:  01/07/2022 21:56   ? ?Procedures ?Procedures  ? ? ?Medications Ordered in ED ?Medications  ?cefTRIAXone (ROCEPHIN) 2 g in sodium chloride 0.9 % 100 mL IVPB (0 g Intravenous Stopped 01/07/22 2213)  ?sodium chloride 0.9 % bolus 1,000 mL (has no administration in time range)  ?sodium chloride 0.9 % bolus 3,000 mL (3,000 mLs Intravenous New Bag/Given 01/07/22 2138)  ?acetaminophen (TYLENOL) tablet 650 mg (650 mg Oral Given 01/07/22 2225)  ? ? ?ED Course/ Medical Decision Making/ A&P ? ,edcr ?CRITICAL CARE ?Performed by: Milton Ferguson ?Total critical care time: 45 minutes ?Critical care  time was exclusive of separately billable procedures and treating other patients. ?Critical care was necessary to treat or prevent imminent or life-threatening deterioration. ?Critical care was time spent personally by me on the following activities: development of treatment plan with patient and/or surrogate as well as nursing, discussions with consultants, evaluation of patient's response to treatment, examination of patient, obtaining history from patient or surrogate, ordering and performing treatments and interventions, ordering and review of laboratory studies, ordering and review of radiographic studies, pulse oximetry and re-evaluation of patient's condition. ? ? ?Patient presents hypotensive and septic from a kidney stone.  Patient also has developing renal failure from the kidney stone.  Sepsis protocol was started.  Patient initially was given 3 L of fluids and her blood pressure increased to 95 systolic.  I spoke with urology Dr. Louis Meckel and he felt like the patient needed to be transferred immediately down to [facility] to have a procedure to help with a kidney stone.  I also spoke with Dr.Desai and she will be involved with the care of the patient from a critical care standpoint. ?                        ?Medical Decision Making ?Amount and/or Complexity of Data Reviewed ?Labs: ordered. ?Radiology: ordered. ?ECG/medicine  tests: ordered. ? ?Risk ?OTC drugs. ?Decision regarding hospitalization. ? ?This patient presents to the ED for concern of weakness, this involves an extensive number of treatment options, and is a complaint that c

## 2022-01-07 NOTE — Progress Notes (Signed)
Pt being followed by [facility] for Sepsis protocol. 

## 2022-01-07 NOTE — ED Notes (Signed)
ED Provider at bedside. 

## 2022-01-07 NOTE — H&P (Signed)
? ?NAME:  Alexa [name], MRN:  [id number], DOB:  Mar 15, 1961, LOS: 0 ?ADMISSION DATE:  01/07/2022, CONSULTATION DATE:  5/6   ?REFERRING MD:  Dr. Roderic [name], CHIEF COMPLAINT:  renal calculi  ? ?History of Present Illness:  ?Patient is a 61 year old female with history of kidney stones and hypothyroidism presents to [facility] ED on 5/6 with renal calculi. ? ?Patient has a history of a 13 mm renal stone that was confirmed on CT scan on 2/24.  Patient was supposed to receive lithotripsy on 5/2 but this got delayed due to patient taking NSAIDs.  Lithotripsy was rescheduled for 5/9.  Patient came in to [facility] ED on 5/3 due to increased right flank pain.  Denies fever.  Patient deferred CT scan at that time.  Patient has been taking Macrobid.  UA concerning for UTI at that time.  Patient requested to be discharged. ? ?On 5/6, patient readmitted to [facility] ED now with fevers, nausea, vomiting over the last few days.  Patient hypotensive with SBP in 80s.  Likely septic from kidney stone.  Labs with AKI.  Patient given 3L IV fluids with improvement in BP.  ED physician spoke with urology and recommended transfer to [facility] for procedure.  PCCM consulted for ICU admission given septic shock ? ?Pertinent  Medical History  ? ?Past Medical History:  ?Diagnosis Date  ? Arthritis   ? back, legs, knees  ? 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 History of kidney stones   ? Hypothyroidism   ? Thyroid disease   ? ? ? ?Significant Hospital Events: ?Including procedures, antibiotic start and stop dates in addition to other pertinent events   ?5/6: admitted to [facility] with sepsis, UTI, hypotension, and renal calculi; transferring to [facility] for urology consult ?5/7: right ureteral stent placement with urology  ? ?Interim History / Subjective:  ? ? ?Objective   ?Blood pressure 96/63, pulse 98, temperature (!) 100.4 ?F (38 ?C), resp. rate 14, height _0  (1.702 m), weight 136.1 kg, SpO2 100 %. ?   ?   ? ?Intake/Output Summary (Last 24 hours) at 01/07/2022 2242 ?Last data filed  at 01/07/2022 2213 ?Gross per 24 hour  ?Intake 100 ml  ?Output --  ?Net 100 ml  ? ?Filed Weights  ? 01/07/22 2051  ?Weight: 136.1 kg  ? ? ?Examination: ?General: resting comfortably, no distress ?HENT: mmm ?Lungs: ctab no wheezes or crackles ?Cardiovascular: RRR no mrg ?Abdomen: soft, nt,nd ?Extremities: no edema ?Neuro: Aox4, no focal asymmetry ?GU: foley in place ? ?Resolved Hospital Problem list   ? ? ?Assessment & Plan:  ? ?Septic Shock secondary to  ?Impacted and infected right renal calculus with hydronephrosis ? ?P: ?- POD 0 from emergent ureteroscopy and stent placement ?-continue abx ?- use vasopressors to maintain MAP>65, titrate down this morning ?-f/u UC and bcx2 ?-trend LA ?-trend WBC/fever curve ?-prn tylenol for fever ? ?AKI ?Hypokalemia ?P: ?-replete K and give mag ?-check mag ?-Trend BMP / urinary output ?-Replace electrolytes as indicated ?-Avoid nephrotoxic agents, ensure adequate renal perfusion ? ?Elevated Alk Phosph ?P: ?-trend CMP ?-may need further workup with checking PTH and vit D outptatient ? ?Thrombocytopenia ?P: ?-trend cbc ? ?Hx of asthma?: on symbicort at home ?P: ?-continue dulera ?-alb prn for wheezing ? ?Hx of DM?: ozempic on home med list ?P: ?-check a1c ?- SSI ? ?Hx of HTN ?P: ?-hold home losartan ? ?Hx of hypothyroidism ?P: ?-check TSH ?-continue synthroid ? ?Best Practice (right click and "Reselect all  SmartList Selections" daily)  ? ?Diet/type: NPO ?DVT prophylaxis: SCD resume heparin once post-procedure ?GI prophylaxis: N/A ?Lines: N/A ?Foley:  Yes, and it is still needed ?Code Status:  full code ?Last date of multidisciplinary goals of care discussion [pending] ? ?Labs   ?CBC: ?Recent Labs  ?Lab 01/04/22 ?0025 01/07/22 ?2047  ?WBC 9.3 7.1  ?NEUTROABS 7.9* 6.1  ?HGB 12.3 12.0  ?HCT 37.1 35.9*  ?MCV 87.5 86.7  ?PLT 138* 121*  ? ? ?Basic Metabolic Panel: ?Recent Labs  ?Lab 01/04/22 ?0025 01/07/22 ?2047  ?NA 138 135  ?K 3.3* 3.0*  ?CL 106 101  ?CO2 21* 24  ?GLUCOSE 143* 168*  ?BUN  18 43*  ?CREATININE 1.39* 2.71*  ?CALCIUM 8.6* 8.3*  ? ?GFR: ?Estimated Creatinine Clearance: 30.7 mL/min (A) (by C-G formula based on SCr of 2.71 mg/dL (H)). ?Recent Labs  ?Lab 01/04/22 ?0025 01/07/22 ?2047 01/07/22 ?2126  ?WBC 9.3 7.1  --   ?LATICACIDVEN  --   --  3.3*  ? ? ?Liver Function Tests: ?Recent Labs  ?Lab 01/07/22 ?2047  ?AST 31  ?ALT 19  ?ALKPHOS 295*  ?BILITOT 1.4*  ?PROT 6.7  ?ALBUMIN 2.7*  ? ?No results for input(s): LIPASE, AMYLASE in the last 168 hours. ?No results for input(s): AMMONIA in the last 168 hours. ? ?ABG ?No results found for: PHART, PCO2ART, PO2ART, HCO3, TCO2, ACIDBASEDEF, O2SAT  ? ?Coagulation Profile: ?Recent Labs  ?Lab 01/07/22 ?2047  ?INR 1.1  ? ? ?Cardiac Enzymes: ?No results for input(s): CKTOTAL, CKMB, CKMBINDEX, TROPONINI in the last 168 hours. ? ?HbA1C: ?No results found for: HGBA1C ? ?CBG: ?No results for input(s): GLUCAP in the last 168 hours. ? ?Review of Systems:   ?Unable to obtain due to patient condition ? ?Past Medical History:  ?She,  has a past medical history of Arthritis, Cancer ([place]) (1984), History of kidney stones, Hypothyroidism, and Thyroid disease.  ? ?Surgical History:  ? ?Past Surgical History:  ?Procedure Laterality Date  ? ABDOMINAL HYSTERECTOMY    ? CARPAL TUNNEL RELEASE Right   ? CHOLECYSTECTOMY    ? COLONOSCOPY N/A 09/23/2018  ? Procedure: COLONOSCOPY;  Surgeon: Rogene Houston, MD;  [facility];  Service: Endoscopy;  Laterality: N/A;  10:30  ? POLYPECTOMY  09/23/2018  ? Procedure: POLYPECTOMY;  Surgeon: Rogene Houston, MD;  Location: [facility];  Service: Endoscopy;;  CS and BX  ? TONSILLECTOMY    ?  ? ?Social History:  ? reports that she has quit smoking. She has never used smokeless tobacco. She reports that she does not drink alcohol and does not use drugs.  ? ?Family History:  ?Her family history is not on file.  ? ?Allergies ?Allergies  ?Allergen Reactions  ? Codeine Nausea Only  ?  ? ?Home Medications  ?Prior to Admission  medications   ?Medication Sig Start Date End Date Taking? Authorizing Provider  ?budesonide-formoterol (SYMBICORT) 80-4.5 MCG/ACT inhaler SMARTSIG:2 Puff(s) By Mouth Twice Daily 09/10/21   [provider]  ?FUROSEMIDE PO Take by mouth as needed. For fluid retention    [provider]  ?levothyroxine (SYNTHROID) 150 MCG tablet Take 150 mcg by mouth daily. 12/14/20   [provider]  ?losartan (COZAAR) 50 MG tablet Take 50 mg by mouth daily. 07/18/21   [provider]  ?meclizine (ANTIVERT) 12.5 MG tablet meclizine 12.5 mg tablet ? TAKE 1 TABLET BY MOUTH TWICE A DAY AS NEEDED    [provider]  ?nitrofurantoin, macrocrystal-monohydrate, (MACROBID) 100 MG capsule  Take 1 capsule (100 mg total) by mouth daily. Begin after completing Bactrim. 11/14/21   Stoneking, Reece Leader., MD  ?oxyCODONE-acetaminophen (PERCOCET) 5-325 MG tablet Take 1 tablet by mouth every 4 (four) hours as needed for moderate pain. 02/09/24   Delora Fuel, MD  ?[id number],9.55 or 0.5MG/DOS, (OZEMPIC, 0.25 OR 0.5 MG/DOSE,) 2 MG/1.5ML SOPN     [provider]  ?  ? ?Critical care time:  ?  ? ?The patient is critically ill due to septic shock.  Critical care was necessary to treat or prevent imminent or life-threatening deterioration.  Critical care was time spent personally by me on the following activities: development of treatment plan with patient and/or surrogate as well as nursing, discussions with consultants, evaluation of patient's response to treatment, examination of patient, obtaining history from patient or surrogate, ordering and performing treatments and interventions, ordering and review of laboratory studies, ordering and review of radiographic studies, pulse oximetry, re-evaluation of patient's condition and participation in multidisciplinary rounds.  ? ?Critical Care Time devoted to patient care services described in this note is 45 minutes. This time reflects time of care of this  [place] . This critical care time does not reflect separately billable procedures or procedure time, teaching time or supervisory time of PA/NP/Med student/Med Resident etc but could involve care

## 2022-01-07 NOTE — Discharge Instructions (Signed)
Patient is being transported to [facility] long emergency department.  Dr. Almyra Free is excepting as the ED doc.  Dr.Herrick is the urologist to see the pt and Dr. Shearon Stalls is critical care that will see the pt ?

## 2022-01-07 NOTE — ED Provider Notes (Addendum)
Patient transferred from [facility] with infected kidney stone and sepsis.  She is awake and alert and nontoxic in appearance but blood pressure is low at 91/60.  Cardiac monitor shows normal sinus rhythm.  Dr. Lahoma Crocker, on call for urology, has been contacted and is coming in to take care of the patient. ?  ?Delora Fuel, MD ?94/50/38 2345 ? ?Blood pressure has dropped to 83/47 in spite of fluids.  We will add norepinephrine. ? ?CRITICAL CARE ?Performed by: Delora Fuel ?Total critical care time: 35 minutes ?Critical care time was exclusive of separately billable procedures and treating other patients. ?Critical care was necessary to treat or prevent imminent or life-threatening deterioration. ?Critical care was time spent personally by me on the following activities: development of treatment plan with patient and/or surrogate as well as nursing, discussions with consultants, evaluation of patient's response to treatment, examination of patient, obtaining history from patient or surrogate, ordering and performing treatments and interventions, ordering and review of laboratory studies, ordering and review of radiographic studies, pulse oximetry and re-evaluation of patient's condition. ? ?  ?Delora Fuel, MD ?88/28/00 2354 ? ?Dr. Lahoma Crocker request patient be admitted to medicine service following surgery.  Case is discussed with Dr. Shearon Stalls of critical care service who agrees to admit the patient. ?  ?Delora Fuel, MD ?34/91/79 0034 ? ?

## 2022-01-07 NOTE — ED Triage Notes (Signed)
Pt arrived via [facility] c/o severe right side flank pain with known renal calculi on right side. Pt reports still able to pass urine. ?

## 2022-01-07 NOTE — ED Notes (Signed)
Critical value ?Lactic acid 3.1 reported to Rosine and Primary RN at this time.  ?

## 2022-01-08 ENCOUNTER — Encounter (HOSPITAL_COMMUNITY)
Admission: EM | Disposition: A | Discharge status: Home / Self Care | Payer: Self-pay | Source: Home / Self Care | Attending: Internal Medicine

## 2022-01-08 ENCOUNTER — Inpatient Hospital Stay (HOSPITAL_COMMUNITY): Payer: 59

## 2022-01-08 ENCOUNTER — Inpatient Hospital Stay (HOSPITAL_COMMUNITY): Payer: 59 | Admitting: Certified Registered"

## 2022-01-08 DIAGNOSIS — J45909 Unspecified asthma, uncomplicated: Secondary | ICD-10-CM | POA: Diagnosis not present

## 2022-01-08 DIAGNOSIS — Z466 Encounter for fitting and adjustment of urinary device: Secondary | ICD-10-CM | POA: Diagnosis not present

## 2022-01-08 DIAGNOSIS — Z20822 Contact with and (suspected) exposure to covid-19: Secondary | ICD-10-CM | POA: Diagnosis not present

## 2022-01-08 DIAGNOSIS — Z1623 Resistance to quinolones and fluoroquinolones: Secondary | ICD-10-CM | POA: Diagnosis not present

## 2022-01-08 DIAGNOSIS — I10 Essential (primary) hypertension: Secondary | ICD-10-CM | POA: Diagnosis not present

## 2022-01-08 DIAGNOSIS — Z6841 Body Mass Index (BMI) 40.0 and over, adult: Secondary | ICD-10-CM | POA: Diagnosis not present

## 2022-01-08 DIAGNOSIS — Z90710 Acquired absence of both cervix and uterus: Secondary | ICD-10-CM | POA: Diagnosis not present

## 2022-01-08 DIAGNOSIS — N200 Calculus of kidney: Secondary | ICD-10-CM | POA: Diagnosis not present

## 2022-01-08 DIAGNOSIS — R6521 Severe sepsis with septic shock: Secondary | ICD-10-CM | POA: Diagnosis not present

## 2022-01-08 DIAGNOSIS — N179 Acute kidney failure, unspecified: Secondary | ICD-10-CM

## 2022-01-08 DIAGNOSIS — E876 Hypokalemia: Secondary | ICD-10-CM | POA: Diagnosis not present

## 2022-01-08 DIAGNOSIS — N201 Calculus of ureter: Secondary | ICD-10-CM

## 2022-01-08 DIAGNOSIS — Z79899 Other long term (current) drug therapy: Secondary | ICD-10-CM | POA: Diagnosis not present

## 2022-01-08 DIAGNOSIS — E039 Hypothyroidism, unspecified: Secondary | ICD-10-CM | POA: Diagnosis not present

## 2022-01-08 DIAGNOSIS — N3289 Other specified disorders of bladder: Secondary | ICD-10-CM | POA: Diagnosis not present

## 2022-01-08 DIAGNOSIS — N136 Pyonephrosis: Secondary | ICD-10-CM | POA: Diagnosis not present

## 2022-01-08 DIAGNOSIS — D6959 Other secondary thrombocytopenia: Secondary | ICD-10-CM | POA: Diagnosis not present

## 2022-01-08 DIAGNOSIS — Z79890 Hormone replacement therapy (postmenopausal): Secondary | ICD-10-CM | POA: Diagnosis not present

## 2022-01-08 DIAGNOSIS — Z87442 Personal history of urinary calculi: Secondary | ICD-10-CM | POA: Diagnosis not present

## 2022-01-08 DIAGNOSIS — A419 Sepsis, unspecified organism: Secondary | ICD-10-CM | POA: Diagnosis present

## 2022-01-08 DIAGNOSIS — Z87891 Personal history of nicotine dependence: Secondary | ICD-10-CM | POA: Diagnosis not present

## 2022-01-08 DIAGNOSIS — Z960 Presence of urogenital implants: Secondary | ICD-10-CM | POA: Diagnosis not present

## 2022-01-08 DIAGNOSIS — A4151 Sepsis due to Escherichia coli [E. coli]: Secondary | ICD-10-CM | POA: Diagnosis not present

## 2022-01-08 DIAGNOSIS — Z9049 Acquired absence of other specified parts of digestive tract: Secondary | ICD-10-CM | POA: Diagnosis not present

## 2022-01-08 DIAGNOSIS — Z7951 Long term (current) use of inhaled steroids: Secondary | ICD-10-CM | POA: Diagnosis not present

## 2022-01-08 HISTORY — PX: CYSTOSCOPY WITH RETROGRADE PYELOGRAM, URETEROSCOPY AND STENT PLACEMENT: SHX5789

## 2022-01-08 LAB — CBC
HCT: 30.9 % — ABNORMAL LOW (ref 36.0–46.0)
HCT: 32.4 % — ABNORMAL LOW (ref 36.0–46.0)
Hemoglobin: 10.1 g/dL — ABNORMAL LOW (ref 12.0–15.0)
Hemoglobin: 10.6 g/dL — ABNORMAL LOW (ref 12.0–15.0)
MCH: 28.6 pg (ref 26.0–34.0)
MCH: 28.8 pg (ref 26.0–34.0)
MCHC: 32.7 g/dL (ref 30.0–36.0)
MCHC: 32.7 g/dL (ref 30.0–36.0)
MCV: 87.5 fL (ref 80.0–100.0)
MCV: 88 fL (ref 80.0–100.0)
Platelets: 110 10*3/uL — ABNORMAL LOW (ref 150–400)
Platelets: 103 10*3/uL — ABNORMAL LOW (ref 150–400)
RBC: 3.53 MIL/uL — ABNORMAL LOW (ref 3.87–5.11)
RBC: 3.68 MIL/uL — ABNORMAL LOW (ref 3.87–5.11)
RDW: 15.1 % (ref 11.5–15.5)
RDW: 15.2 % (ref 11.5–15.5)
WBC: 16.8 10*3/uL — ABNORMAL HIGH (ref 4.0–10.5)
WBC: 12.3 10*3/uL — ABNORMAL HIGH (ref 4.0–10.5)
nRBC: 0 % (ref 0.0–0.2)
nRBC: 0 % (ref 0.0–0.2)

## 2022-01-08 LAB — BLOOD CULTURE ID PANEL (REFLEXED) - BCID2

## 2022-01-08 LAB — BASIC METABOLIC PANEL
Anion gap: 9 (ref 5–15)
BUN: 40 mg/dL — ABNORMAL HIGH (ref 8–23)
CO2: 21 mmol/L — ABNORMAL LOW (ref 22–32)
Calcium: 7.7 mg/dL — ABNORMAL LOW (ref 8.9–10.3)
Chloride: 107 mmol/L (ref 98–111)
Creatinine, Ser: 2.52 mg/dL — ABNORMAL HIGH (ref 0.44–1.00)
GFR, Estimated: 21 mL/min — ABNORMAL LOW (ref >60)
Glucose, Bld: 167 mg/dL — ABNORMAL HIGH (ref 70–99)
Potassium: 3.9 mmol/L (ref 3.5–5.1)
Sodium: 137 mmol/L (ref 135–145)

## 2022-01-08 LAB — CREATININE, SERUM
Creatinine, Ser: 2.74 mg/dL — ABNORMAL HIGH (ref 0.44–1.00)
GFR, Estimated: 19 mL/min — ABNORMAL LOW (ref >60)

## 2022-01-08 LAB — HIV ANTIBODY (ROUTINE TESTING W REFLEX): HIV Screen 4th Generation wRfx: NONREACTIVE

## 2022-01-08 LAB — MAGNESIUM: Magnesium: 1.8 mg/dL (ref 1.7–2.4)

## 2022-01-08 LAB — PHOSPHORUS: Phosphorus: 2.7 mg/dL (ref 2.5–4.6)

## 2022-01-08 LAB — MRSA NEXT GEN BY PCR, NASAL: MRSA by PCR Next Gen: NOT DETECTED

## 2022-01-08 LAB — LACTIC ACID, PLASMA
Lactic Acid, Venous: 2.2 mmol/L (ref 0.5–1.9)
Lactic Acid, Venous: 2.6 mmol/L (ref 0.5–1.9)

## 2022-01-08 SURGERY — CYSTOURETEROSCOPY, WITH RETROGRADE PYELOGRAM AND STENT INSERTION
Anesthesia: General | Site: Ureter | Laterality: Right

## 2022-01-08 MED ORDER — SODIUM CHLORIDE 0.9 % IR SOLN
Status: DC | PRN
  Administered 2022-01-08: 1000 mL

## 2022-01-08 MED ORDER — FAMOTIDINE IN NACL 20-0.9 MG/50ML-% IV SOLN
20.0000 mg | Freq: Two times a day (BID) | INTRAVENOUS | Status: DC
  Administered 2022-01-08 – 2022-01-09 (×3): 20 mg via INTRAVENOUS
  Filled 2022-01-08 (×3): qty 50

## 2022-01-08 MED ORDER — ONDANSETRON HCL 4 MG/2ML IJ SOLN
INTRAMUSCULAR | Status: DC | PRN
Start: 2022-01-08 — End: 2022-01-08
  Administered 2022-01-08: 4 mg via INTRAVENOUS

## 2022-01-08 MED ORDER — FENTANYL CITRATE (PF) 100 MCG/2ML IJ SOLN
INTRAMUSCULAR | Status: AC
  Filled 2022-01-08: qty 2

## 2022-01-08 MED ORDER — SODIUM CHLORIDE 0.9 % IR SOLN
Status: DC | PRN
  Administered 2022-01-08: 3000 mL

## 2022-01-08 MED ORDER — DROPERIDOL 2.5 MG/ML IJ SOLN: 0.6250 mg | Freq: Once | INTRAMUSCULAR | Status: DC | PRN

## 2022-01-08 MED ORDER — FENTANYL CITRATE PF 50 MCG/ML IJ SOSY: 25.0000 ug | PREFILLED_SYRINGE | INTRAMUSCULAR | Status: DC | PRN

## 2022-01-08 MED ORDER — CHLORHEXIDINE GLUCONATE CLOTH 2 % EX PADS
6.0000 | MEDICATED_PAD | Freq: Every day | CUTANEOUS | Status: DC
  Administered 2022-01-08 – 2022-01-10 (×4): 6 via TOPICAL

## 2022-01-08 MED ORDER — POLYETHYLENE GLYCOL 3350 17 G PO PACK: 17.0000 g | PACK | Freq: Every day | ORAL | Status: DC | PRN

## 2022-01-08 MED ORDER — ACETAMINOPHEN 10 MG/ML IV SOLN
INTRAVENOUS | Status: AC
  Filled 2022-01-08: qty 100

## 2022-01-08 MED ORDER — OXYCODONE-ACETAMINOPHEN 5-325 MG PO TABS
1.0000 | ORAL_TABLET | Freq: Once | ORAL | Status: AC
  Administered 2022-01-08: 1 via ORAL
  Filled 2022-01-08: qty 1

## 2022-01-08 MED ORDER — MIDAZOLAM HCL 2 MG/2ML IJ SOLN
INTRAMUSCULAR | Status: AC
  Filled 2022-01-08: qty 2

## 2022-01-08 MED ORDER — DIPHENHYDRAMINE HCL 25 MG PO CAPS
25.0000 mg | ORAL_CAPSULE | ORAL | Status: AC
Start: 2022-01-08 — End: 2022-01-09

## 2022-01-08 MED ORDER — SODIUM CHLORIDE 0.9 % IV SOLN
2.0000 g | INTRAVENOUS | Status: DC
  Administered 2022-01-08 – 2022-01-10 (×3): 2 g via INTRAVENOUS
  Filled 2022-01-08 (×3): qty 20

## 2022-01-08 MED ORDER — SUCCINYLCHOLINE CHLORIDE 200 MG/10ML IV SOSY
PREFILLED_SYRINGE | INTRAVENOUS | Status: DC | PRN
  Administered 2022-01-08: 160 mg via INTRAVENOUS

## 2022-01-08 MED ORDER — LIDOCAINE 2% (20 MG/ML) 5 ML SYRINGE
INTRAMUSCULAR | Status: DC | PRN
  Administered 2022-01-08: 100 mg via INTRAVENOUS

## 2022-01-08 MED ORDER — IOHEXOL 300 MG/ML  SOLN
INTRAMUSCULAR | Status: DC | PRN
  Administered 2022-01-08: 10 mL via URETHRAL

## 2022-01-08 MED ORDER — FENTANYL CITRATE PF 50 MCG/ML IJ SOSY: 12.5000 ug | PREFILLED_SYRINGE | Freq: Once | INTRAMUSCULAR | Status: DC

## 2022-01-08 MED ORDER — HEPARIN SODIUM (PORCINE) 5000 UNIT/ML IJ SOLN
5000.0000 [IU] | Freq: Three times a day (TID) | INTRAMUSCULAR | Status: DC
  Administered 2022-01-08 – 2022-01-11 (×10): 5000 [IU] via SUBCUTANEOUS
  Filled 2022-01-08 (×10): qty 1

## 2022-01-08 MED ORDER — LACTATED RINGERS IV BOLUS
1000.0000 mL | Freq: Once | INTRAVENOUS | Status: AC
Start: 2022-01-08 — End: 2022-01-08
  Administered 2022-01-08: 1000 mL via INTRAVENOUS

## 2022-01-08 MED ORDER — PROPOFOL 10 MG/ML IV BOLUS
INTRAVENOUS | Status: AC
Start: 2022-01-08 — End: ?
  Filled 2022-01-08: qty 20

## 2022-01-08 MED ORDER — SODIUM CHLORIDE (PF) 0.9 % IJ SOLN
INTRAMUSCULAR | Status: AC
  Filled 2022-01-08: qty 20

## 2022-01-08 MED ORDER — DOCUSATE SODIUM 100 MG PO CAPS: 100.0000 mg | ORAL_CAPSULE | Freq: Two times a day (BID) | ORAL | Status: DC | PRN

## 2022-01-08 MED ORDER — FENTANYL CITRATE (PF) 100 MCG/2ML IJ SOLN
INTRAMUSCULAR | Status: DC | PRN
  Administered 2022-01-08: 100 ug via INTRAVENOUS

## 2022-01-08 MED ORDER — SODIUM CHLORIDE 0.9 % IV SOLN: Freq: Once | INTRAVENOUS | Status: AC

## 2022-01-08 MED ORDER — ETOMIDATE 2 MG/ML IV SOLN
INTRAVENOUS | Status: DC | PRN
  Administered 2022-01-08: 10 mg via INTRAVENOUS

## 2022-01-08 MED ORDER — PROPOFOL 10 MG/ML IV BOLUS
INTRAVENOUS | Status: DC | PRN
  Administered 2022-01-08: 100 mg via INTRAVENOUS
  Administered 2022-01-08: 30 mg via INTRAVENOUS

## 2022-01-08 MED ORDER — EPINEPHRINE PF 1 MG/ML IJ SOLN
INTRAMUSCULAR | Status: AC
  Filled 2022-01-08: qty 1

## 2022-01-08 MED ORDER — NOREPINEPHRINE 4 MG/250ML-% IV SOLN: 2.0000 ug/min | INTRAVENOUS | Status: DC

## 2022-01-08 MED ORDER — DEXAMETHASONE SODIUM PHOSPHATE 10 MG/ML IJ SOLN
INTRAMUSCULAR | Status: DC | PRN
  Administered 2022-01-08: 10 mg via INTRAVENOUS

## 2022-01-08 MED ORDER — DIAZEPAM 5 MG PO TABS: 10.0000 mg | ORAL_TABLET | Freq: Once | ORAL | Status: DC

## 2022-01-08 MED ORDER — OXYCODONE HCL 5 MG/5ML PO SOLN: 5.0000 mg | Freq: Once | ORAL | Status: DC | PRN

## 2022-01-08 MED ORDER — OXYCODONE HCL 5 MG PO TABS: 5.0000 mg | ORAL_TABLET | Freq: Once | ORAL | Status: DC | PRN

## 2022-01-08 MED ORDER — LACTATED RINGERS IV SOLN: INTRAVENOUS | Status: DC | PRN

## 2022-01-08 MED ORDER — LEVOTHYROXINE SODIUM 75 MCG PO TABS
150.0000 ug | ORAL_TABLET | Freq: Every day | ORAL | Status: DC
  Administered 2022-01-08 – 2022-01-11 (×4): 150 ug via ORAL
  Filled 2022-01-08 (×4): qty 2

## 2022-01-08 MED ORDER — SODIUM CHLORIDE 0.9 % IV SOLN
250.0000 mL | INTRAVENOUS | Status: DC
  Administered 2022-01-08: 250 mL via INTRAVENOUS

## 2022-01-08 SURGICAL SUPPLY — 26 items
BAG URO CATCHER STRL LF (MISCELLANEOUS) ×2 IMPLANT
BASKET ZERO TIP NITINOL 2.4FR (BASKET) IMPLANT
BSKT STON RTRVL ZERO TP 2.4FR (BASKET)
CATH URETL OPEN 5X70 (CATHETERS) ×2 IMPLANT
CLOTH BEACON ORANGE TIMEOUT ST (SAFETY) ×2 IMPLANT
EXTRACTOR STONE 1.7FRX115CM (UROLOGICAL SUPPLIES) IMPLANT
GLOVE BIO SURGEON STRL SZ7 (GLOVE) ×2 IMPLANT
GLOVE BIOGEL PI IND STRL 6.5 (GLOVE) IMPLANT
GLOVE BIOGEL PI INDICATOR 6.5 (GLOVE) ×2
GLOVE SURG LX 7.5 STRW (GLOVE) ×1
GLOVE SURG LX STRL 7.5 STRW (GLOVE) ×1 IMPLANT
GOWN STRL REUS W/ TWL LRG LVL3 (GOWN DISPOSABLE) IMPLANT
GOWN STRL REUS W/ TWL XL LVL3 (GOWN DISPOSABLE) ×1 IMPLANT
GOWN STRL REUS W/TWL LRG LVL3 (GOWN DISPOSABLE) ×2
GOWN STRL REUS W/TWL XL LVL3 (GOWN DISPOSABLE) ×2
GUIDEWIRE ANG ZIPWIRE 038X150 (WIRE) IMPLANT
GUIDEWIRE STR DUAL SENSOR (WIRE) ×2 IMPLANT
MANIFOLD NEPTUNE II (INSTRUMENTS) ×2 IMPLANT
MAT HALF PREVALON HALF STRYKER (MISCELLANEOUS) ×1 IMPLANT
PACK CYSTO (CUSTOM PROCEDURE TRAY) ×2 IMPLANT
SHEATH NAVIGATOR HD 11/13X28 (SHEATH) IMPLANT
SHEATH NAVIGATOR HD 11/13X36 (SHEATH) IMPLANT
STENT URET 6FRX26 CONTOUR (STENTS) ×1 IMPLANT
TRAY FOLEY MTR SLVR 14FR STAT (SET/KITS/TRAYS/PACK) ×1 IMPLANT
TUBING CONNECTING 10 (TUBING) ×2 IMPLANT
TUBING UROLOGY SET (TUBING) ×2 IMPLANT

## 2022-01-08 NOTE — Progress Notes (Signed)
Received call from lab at [facility] that patient's blood culture came back positive/gram negative rods. Dr. Vaughan Browner, MD and Dr. Louis Meckel, MD made aware of this.  ?

## 2022-01-08 NOTE — Progress Notes (Signed)
4 rings gave to husband by [employer]. Pt tx from ER with Levophed 70mg/min, and f-cath.to pre-op site. ?

## 2022-01-08 NOTE — ED Notes (Signed)
Urology at bedside.

## 2022-01-08 NOTE — Progress Notes (Signed)
eLink Physician-Brief Progress Note ?Patient Name: Alexa [name] ?DOB: 27-Jan-1961 ?MRN: [id number] ? ? ?Date of Service ? 01/08/2022  ?HPI/Events of Note ? Multiple issues: 1. PCCM consult not finished. 2. Lactic Acid = 3.3 --> 2.6 post op. 3. No pain medications 4. Patient c/o gastric reflux and 5. Patient requesting a diet.   ?eICU Interventions ? Plan: ?PCCM ground team notified of patient's arrival in ICU. ?Fentanyl 12.5 mcg IV X 1.  ?Pepcid 20 mg IV now and BID. ?Will change to NPO except ice chips and sips with meds to be sure that she has no difficulty swallowing or nausea prior to advancing diet.   ? ? ? ?Intervention Category ?Major Interventions: Acid-Base disturbance - evaluation and management;Other: ? ?Dajha Urquilla Cornelia Copa ?01/08/2022, 3:09 AM ?

## 2022-01-08 NOTE — Progress Notes (Signed)
S/p right ureteral stent placement 5/7 ? ?Subjective: ?Feeling much improved since stent placement  ?Remains afebrile ?Blood pressure improved, now off of vasopressors  ? ?Objective: ?Vital signs in last 24 hours: ?Temp:  [97.6 ?F (36.4 ?C)-101.1 ?F (38.4 ?C)] 97.7 ?F (36.5 ?C) (05/07 2585) ?Pulse Rate:  [71-118] 71 (05/07 0700) ?Resp:  [14-30] 18 (05/07 0700) ?BP: (81-119)/(45-78) 101/58 (05/07 0700) ?SpO2:  [92 %-100 %] 96 % (05/07 0700) ?Weight:  [136.1 kg-149 kg] 149 kg (05/07 0215) ? ?Intake/Output from previous day: ?05/06 0701 - 05/07 0700 ?In: 607 [I.V.:507; IV Piggyback:100] ?Out: 250 [Urine:250] ?Intake/Output this shift: ?No intake/output data recorded. ? ?Physical Exam:  ?General: Alert and oriented ?CV: RRR ?Lungs: Clear ?Abdomen: Soft, ND ?GU: Foley catheter in place urine has cleared to yellow  ?Ext: NT, No erythema ? ?Lab Results: ?Recent Labs  ?  01/07/22 ?2047 01/08/22 ?0157 01/08/22 ?2778  ?HGB 12.0 10.1* 10.6*  ?HCT 35.9* 30.9* 32.4*  ? ?BMET ?Recent Labs  ?  01/07/22 ?2047 01/08/22 ?0157 01/08/22 ?2423  ?NA 135  --  137  ?K 3.0*  --  3.9  ?CL 101  --  107  ?CO2 24  --  21*  ?GLUCOSE 168*  --  167*  ?BUN 43*  --  40*  ?CREATININE 2.71* 2.74* 2.52*  ?CALCIUM 8.3*  --  7.7*  ? ? ? ?Studies/Results: ?DG Chest Port 1 View ? ?Result Date: 01/07/2022 ?CLINICAL DATA:  Sepsis EXAM: PORTABLE CHEST 1 VIEW COMPARISON:  None Available. FINDINGS: The heart size and mediastinal contours are within normal limits. Both lungs are clear. The visualized skeletal structures are unremarkable. IMPRESSION: No active disease. Electronically Signed   By: Ulyses Jarred M.D.   On: 01/07/2022 21:56  ? ?DG C-Arm 1-60 Min-No Report ? ?Result Date: 01/08/2022 ?Fluoroscopy was utilized by the requesting physician.  No radiographic interpretation.   ? ?Assessment/Plan: ?61 y/o female with sepsis due to 1.3cm right proximal obstructing stone s/p stent placement 5/7 ? ?Clinically improving since stent placement.  ? ?-Agree with broad  spectrum antibiotics while cultures pending  ?-Resuscitation per primary team  ?-Okay for foley removal today with TOV ?-Patient will need definitive stone management in 2-3 weeks as outpatient with her urologist Dr. Felipa Eth. ESWL for tomorrow to be canceled.  ? ? LOS: 0 days  ? ?Alexa [name] ?01/08/2022, 9:27 AM ? ?  ?

## 2022-01-08 NOTE — Plan of Care (Signed)
  Problem: Education: Goal: Knowledge of General Education information will improve Description Including pain rating scale, medication(s)/side effects and non-pharmacologic comfort measures Outcome: Progressing   Problem: Health Behavior/Discharge Planning: Goal: Ability to manage health-related needs will improve Outcome: Progressing   

## 2022-01-08 NOTE — Progress Notes (Signed)
PHARMACY - PHYSICIAN COMMUNICATION ?CRITICAL VALUE ALERT - BLOOD CULTURE IDENTIFICATION (BCID) ? ?Alexa [name] is an 61 y.o. female with kidney stone who presented to [facility] on 01/07/2022 with a chief complaint of right sided frank pain.  He underwent right ureteral stent placement on 01/08/22.  She was started on ceftriaxone on admission for UTI.  One of four blood culture bottles collected on has GNR (BCID= Ecoli).  ? ?Name of physician (or Provider) Contacted: Dr. Vaughan Browner ? ?Current antibiotics: ceftriaxone 2gm q24h ? ?Changes to prescribed antibiotics recommended:  ?- continue current abx  ? ?Results for orders placed or performed during the hospital encounter of 01/07/22  ?Blood Culture ID Panel (Reflexed) (Collected: 01/07/2022  9:27 PM)  ?Result Value Ref Range  ? Enterococcus faecalis NOT DETECTED NOT DETECTED  ? Enterococcus Faecium NOT DETECTED NOT DETECTED  ? Listeria monocytogenes NOT DETECTED NOT DETECTED  ? Staphylococcus species NOT DETECTED NOT DETECTED  ? Staphylococcus aureus (BCID) NOT DETECTED NOT DETECTED  ? Staphylococcus epidermidis NOT DETECTED NOT DETECTED  ? Staphylococcus lugdunensis NOT DETECTED NOT DETECTED  ? Streptococcus species NOT DETECTED NOT DETECTED  ? Streptococcus agalactiae NOT DETECTED NOT DETECTED  ? Streptococcus pneumoniae NOT DETECTED NOT DETECTED  ? Streptococcus pyogenes NOT DETECTED NOT DETECTED  ? A.calcoaceticus-baumannii NOT DETECTED NOT DETECTED  ? Bacteroides fragilis NOT DETECTED NOT DETECTED  ? Enterobacterales DETECTED (A) NOT DETECTED  ? Enterobacter cloacae complex NOT DETECTED NOT DETECTED  ? Escherichia coli DETECTED (A) NOT DETECTED  ? Klebsiella aerogenes NOT DETECTED NOT DETECTED  ? Klebsiella oxytoca NOT DETECTED NOT DETECTED  ? Klebsiella pneumoniae NOT DETECTED NOT DETECTED  ? Proteus species NOT DETECTED NOT DETECTED  ? Salmonella species NOT DETECTED NOT DETECTED  ? Serratia marcescens NOT DETECTED NOT DETECTED  ? Haemophilus influenzae NOT  DETECTED NOT DETECTED  ? Neisseria meningitidis NOT DETECTED NOT DETECTED  ? Pseudomonas aeruginosa NOT DETECTED NOT DETECTED  ? Stenotrophomonas maltophilia NOT DETECTED NOT DETECTED  ? Candida albicans NOT DETECTED NOT DETECTED  ? Candida auris NOT DETECTED NOT DETECTED  ? Candida glabrata NOT DETECTED NOT DETECTED  ? Candida krusei NOT DETECTED NOT DETECTED  ? Candida parapsilosis NOT DETECTED NOT DETECTED  ? Candida tropicalis NOT DETECTED NOT DETECTED  ? Cryptococcus neoformans/gattii NOT DETECTED NOT DETECTED  ? CTX-M ESBL NOT DETECTED NOT DETECTED  ? Carbapenem resistance IMP NOT DETECTED NOT DETECTED  ? Carbapenem resistance KPC NOT DETECTED NOT DETECTED  ? Carbapenem resistance NDM NOT DETECTED NOT DETECTED  ? Carbapenem resist OXA 48 LIKE NOT DETECTED NOT DETECTED  ? Carbapenem resistance VIM NOT DETECTED NOT DETECTED  ? ? ?Dia Sitter P ?01/08/2022  5:48 PM ? ?

## 2022-01-08 NOTE — Anesthesia Procedure Notes (Signed)
Procedure Name: Intubation ?Date/Time: 01/08/2022 1:15 AM ?Performed by: Cleda Daub, CRNA ?Pre-anesthesia Checklist: Patient identified, Emergency Drugs available, Suction available and Patient being monitored ?Patient Re-evaluated:Patient Re-evaluated prior to induction ?Oxygen Delivery Method: Circle system utilized ?Preoxygenation: Pre-oxygenation with 100% oxygen ?Induction Type: IV induction ?Ventilation: Mask ventilation without difficulty ?Laryngoscope Size: Glidescope and 4 ?Grade View: Grade I ?Tube type: Oral ?Number of attempts: 1 ?Airway Equipment and Method: Stylet and Oral airway ?Placement Confirmation: ETT inserted through vocal cords under direct vision, positive ETCO2 and breath sounds checked- equal and bilateral ?Secured at: 21 cm ?Tube secured with: Tape ?Dental Injury: Teeth and Oropharynx as per pre-operative assessment  ? ? ? ? ?

## 2022-01-08 NOTE — Transfer of Care (Signed)
Immediate Anesthesia Transfer of Care Note ? ?Patient: Alexa [name] ? ?Procedure(s) Performed: CYSTOSCOPY WITH RETROGRADE PYELOGRAM, URETEROSCOPY AND STENT PLACEMENT (Right: Ureter) ? ?Patient Location: PACU ? ?Anesthesia Type:General ? ?Level of Consciousness: awake, alert , oriented and patient cooperative ? ?Airway & Oxygen Therapy: Patient Spontanous Breathing and Patient connected to face mask oxygen ? ?Post-op Assessment: Report given to RN and Post -op Vital signs reviewed and stable ? ?Post vital signs: Reviewed and stable ? ?Last Vitals:  ?Vitals Value Taken Time  ?BP 119/61 01/08/22 0147  ?Temp    ?Pulse 96 01/08/22 0149  ?Resp 19 01/08/22 0149  ?SpO2 96 % 01/08/22 0149  ?Vitals shown include unvalidated device data. ? ?Last Pain:  ?Vitals:  ? 01/08/22 0056  ?TempSrc:   ?PainSc: 3   ?   ? ?  ? ?Complications: No notable events documented. ?

## 2022-01-08 NOTE — Progress Notes (Signed)
Patient didn't receive vasopressors at this time. Informed patient's RN that she put in the consult if patient needs vasopressors. HS Truman Hayward RN ?

## 2022-01-08 NOTE — Progress Notes (Addendum)
? ?NAME:  Alexa [name], MRN:  [id number], DOB:  [birth year], LOS: 0 ?ADMISSION DATE:  01/07/2022, CONSULTATION DATE:  5/6   ?REFERRING MD:  Dr. Roderic Palau, CHIEF COMPLAINT:  renal calculi  ? ?History of Present Illness:  ?Patient is a 61 year old female with history of kidney stones and hypothyroidism presents to [facility] ED on 5/6 with renal calculi. ? ?Patient has a history of a 13 mm renal stone that was confirmed on CT scan on 2/24.  Patient was supposed to receive lithotripsy on 5/2 but this got delayed due to patient taking NSAIDs.  Lithotripsy was rescheduled for 5/9.  Patient came in to [facility] ED on 5/3 due to increased right flank pain.  Denies fever.  Patient deferred CT scan at that time.  Patient has been taking Macrobid.  UA concerning for UTI at that time.  Patient requested to be discharged. ? ?On 5/6, patient readmitted to [facility] ED now with fevers, nausea, vomiting over the last few days.  Patient hypotensive with SBP in 80s.  Likely septic from kidney stone.  Labs with AKI.  Patient given 3L IV fluids with improvement in BP.  ED physician spoke with urology and recommended transfer to [facility] for procedure.  PCCM consulted for ICU admission given septic shock. ? ?Pertinent  Medical History  ? ?Past Medical History:  ?Diagnosis Date  ? Arthritis   ? back, legs, knees  ? 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 History of kidney stones   ? Hypothyroidism   ? Thyroid disease   ? ? ? ?Significant Hospital Events: ?Including procedures, antibiotic start and stop dates in addition to other pertinent events   ?5/6: admitted to [facility] with sepsis, UTI, hypotension, and renal calculi; transferring to [facility] for urology consult ?5/7: right ureteral stent placement with urology  ? ?Interim History / Subjective:  ? ?She required Levophed briefly during procedure but has been off pressors in the ICU.  Awake with no complaints ?Asking for food to eat ? ?Objective   ?Blood pressure (!) 101/48, pulse 72, temperature 97.6 ?F (36.4 ?C), resp. rate  18, height _0  (1.702 m), weight (!) 149 kg, SpO2 95 %. ?   ?   ? ?Intake/Output Summary (Last 24 hours) at 01/08/2022 0737 ?Last data filed at 01/08/2022 0256 ?Gross per 24 hour  ?Intake 607.04 ml  ?Output 250 ml  ?Net 357.04 ml  ? ?Filed Weights  ? 01/07/22 2051 01/08/22 0215  ?Weight: 136.1 kg (!) 149 kg  ? ? ?Examination: ?Blood pressure (!) 101/48, pulse 72, temperature 97.6 ?F (36.4 ?C), resp. rate 18, height _1  (1.702 m), weight (!) 149 kg, SpO2 95 %. ?Gen:      No acute distress ?HEENT:  EOMI, sclera anicteric ?Neck:     No masses; no thyromegaly ?Lungs:    Clear to auscultation bilaterally; normal respiratory effort ?CV:         Regular rate and rhythm; no murmurs ?Abd:      + bowel sounds; soft, non-tender; no palpable masses, no distension ?Ext:    No edema; adequate peripheral perfusion ?Skin:      Warm and dry; no rash ?Neuro: alert and oriented x 3 ?Psych: normal mood and affect  ? ?Labs/imaging reviewed ?Significant for ?Creatinine improved to 2.52, WBC 12.3, hemoglobin 10.6, platelets 103 ?No new imaging ? ?Resolved Hospital Problem list   ?Hypokalemia ? ?Assessment & Plan:  ? ?Septic Shock secondary to  ?Impacted and infected right renal calculus with hydronephrosis ? ?  P: ?- POD 1 from emergent ureteroscopy and stent placement ?Continue abx.  Currently on ceftriaxone ?Follow cultures ? ?AKI ?P: ?Creatinine is better today.  Continue monitoring. ? ?Elevated Alk Phosph ?P: ?Follow CMP ?May need further workup with checking PTH and vit D outptatient ? ?Thrombocytopenia ?P: ?Follow CBC ? ?Hx of asthma?: on symbicort at home ?P: ?Continue inhalers ? ?Hx of DM?: ozempic on home med list ?P: ?SSI coverage ? ?Hx of HTN ?P: ?Home hypertension ? ?Hx of hypothyroidism ?P: ?Continue Synthroid, check TSH ? ?Stable for transfer out of ICU and to hospitalist service.  PCCM off 5/8 ? ?Best Practice (right click and "Reselect all SmartList Selections" daily)  ? ?Diet/type: Regular consistency (see orders) ?DVT  prophylaxis: SCD resume heparin once post-procedure ?GI prophylaxis: N/A ?Lines: N/A ?Foley:  Yes, and it is still needed ?Code Status:  full code ?Last date of multidisciplinary goals of care discussion [pending] ? ?Signature:  ? ?Marshell Garfinkel MD ?[facility] ?See Amion for pager ? ?If no response to pager , please call [phone] until 7pm ?After 7:00 pm call Elink  [phone] ?01/08/2022, 7:37 AM  ? ? ? ?

## 2022-01-08 NOTE — Consult Note (Signed)
Urology Consult Note  ? ?Requesting Attending Physician:  Delora Fuel, MD ?Service Providing Consult: Urology  ? ?Reason for Consult:  Nephrolithiasis ? ?HPI: Alexa [name] is seen in consultation for right nephrolithiasis at the request of  Delora Fuel, MD. ? ?Patient has known 1.3cm renal pelvis stone confirmed on CT from 2/24. She was scheduled for ESWL last week but had taken ibuprofen so this was cancelled and rescheduled for this coming Tuesday. Approximately 5 days ago she developed severe right flank pain prompting her presentation to the ED. At that time she was offered a repeat CT scan which she declined. Labs then were reassuring so she was discharged. She developed fevers and chills over the last 48 hours so represented to the [place].  ? ?In the ED she as found to be septic with fever to 101.43F and hypotensive with systolics in the 82X. Labwork notable for AKI to 2.7 (baseline 1.0) and lactate 3.3. UA showing large leukocytes negative nitrites and many bacteria. She was emergently transferred to the [facility] ED for emergent ureteral stenting. Norepinephrine being started for hypotension on arrival.  ? ? ?Past Medical History: ?Past Medical History:  ?Diagnosis Date  ? Arthritis   ? back, legs, knees  ? 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 History of kidney stones   ? Hypothyroidism   ? Thyroid disease   ? ? ?Past Surgical History:  ?Past Surgical History:  ?Procedure Laterality Date  ? ABDOMINAL HYSTERECTOMY    ? CARPAL TUNNEL RELEASE Right   ? CHOLECYSTECTOMY    ? COLONOSCOPY N/A 09/23/2018  ? Procedure: COLONOSCOPY;  Surgeon: Rogene Houston, MD;  [facility];  Service: Endoscopy;  Laterality: N/A;  10:30  ? POLYPECTOMY  09/23/2018  ? Procedure: POLYPECTOMY;  Surgeon: Rogene Houston, MD;  Location: [facility];  Service: Endoscopy;;  CS and BX  ? TONSILLECTOMY    ? ? ?Medication: ?Current Facility-Administered Medications  ?Medication Dose Route Frequency Provider Last  Rate Last Admin  ? cefTRIAXone (ROCEPHIN) 2 g in sodium chloride 0.9 % 100 mL IVPB  2 g Intravenous Q24H Milton Ferguson, MD   Stopped at 01/07/22 2213  ? norepinephrine (LEVOPHED) '4mg'$  in 258m (0.016 mg/mL) premix infusion  0-40 mcg/min Intravenous Continuous GDelora Fuel MD 7.5 mL/hr at 01/08/22 0000 2 mcg/min at 01/08/22 0000  ? ?Current Outpatient Medications  ?Medication Sig Dispense Refill  ? budesonide-formoterol (SYMBICORT) 80-4.5 MCG/ACT inhaler SMARTSIG:2 Puff(s) By Mouth Twice Daily    ? FUROSEMIDE PO Take by mouth as needed. For fluid retention    ? levothyroxine (SYNTHROID) 150 MCG tablet Take 150 mcg by mouth daily.    ? losartan (COZAAR) 50 MG tablet Take 50 mg by mouth daily.    ? meclizine (ANTIVERT) 12.5 MG tablet meclizine 12.5 mg tablet ? TAKE 1 TABLET BY MOUTH TWICE A DAY AS NEEDED    ? nitrofurantoin, macrocrystal-monohydrate, (MACROBID) 100 MG capsule Take 1 capsule (100 mg total) by mouth daily. Begin after completing Bactrim. 30 capsule 2  ? oxyCODONE-acetaminophen (PERCOCET) 5-325 MG tablet Take 1 tablet by mouth every 4 (four) hours as needed for moderate pain. 6 tablet 0  ? Semaglutide,0.25 or 0.'5MG'$ /DOS, (OZEMPIC, 0.25 OR 0.5 MG/DOSE,) 2 MG/1.5ML SOPN     ? ? ?Allergies: ?Allergies  ?Allergen Reactions  ? Codeine Nausea Only  ? ? ?Social History: ?Social History  ? ?Tobacco Use  ? Smoking status: Former  ? Smokeless tobacco: Never  ?Vaping Use  ?  Vaping Use: Never used  ?Substance Use Topics  ? Alcohol use: No  ? Drug use: No  ? ? ?Family History ?History reviewed. No pertinent family history. ? ?Review of Systems ?10 systems were reviewed and are negative except as noted specifically in the HPI. ? ?Objective  ? ?Vital signs in last 24 hours: ?BP (!) 98/58   Pulse 93   Temp 99.4 ?F (37.4 ?C)   Resp (!) 22   Ht '5\' 7"'$  (1.702 m)   Wt 136.1 kg   SpO2 98%   BMI 46.99 kg/m?  ? ?Physical Exam ?General: NAD, A&O, resting, appropriate ?HEENT: [place]/AT, EOMI, MMM ?Pulmonary: Normal work of  breathing ?Cardiovascular: HDS, adequate peripheral perfusion ?Abdomen: Soft, NTTP, nondistended. ?GU: Voiding spontaneously, Mild Right CVA tenderness ?Extremities: warm and well perfused ?Neuro: Appropriate, no focal neurological deficits ? ?Most Recent Labs: ?Lab Results  ?Component Value Date  ? WBC 7.1 01/07/2022  ? HGB 12.0 01/07/2022  ? HCT 35.9 (L) 01/07/2022  ? PLT 121 (L) 01/07/2022  ? ? ?Lab Results  ?Component Value Date  ? NA 135 01/07/2022  ? K 3.0 (L) 01/07/2022  ? CL 101 01/07/2022  ? CO2 24 01/07/2022  ? BUN 43 (H) 01/07/2022  ? CREATININE 2.71 (H) 01/07/2022  ? CALCIUM 8.3 (L) 01/07/2022  ? ? ?Lab Results  ?Component Value Date  ? INR 1.1 01/07/2022  ? APTT 27 01/07/2022  ? ? ? ?Urine Culture: ?Pending  ? ?IMAGING: ?DG Chest Port 1 View ? ?Result Date: 01/07/2022 ?CLINICAL DATA:  Sepsis EXAM: PORTABLE CHEST 1 VIEW COMPARISON:  None Available. FINDINGS: The heart size and mediastinal contours are within normal limits. Both lungs are clear. The visualized skeletal structures are unremarkable. IMPRESSION: No active disease. Electronically Signed   By: Ulyses Jarred M.D.   On: 01/07/2022 21:56   ? ?------ ? ?Assessment: ? ?61 y.o. female with sepsis from right obstructing 1.3cm UPJ stone ? ?Concerning for infection given: lactate 3.3, fever, hypotension, positive UA ?In the ED received: 2g CTX ? ?We discussed the indications for acute intervention including infected obstruction, bilateral ureteral obstruction or unilateral obstruction of solitary kidney as well as other less urgent indications for decompression which would included intractable pain, N/V, and acute renal injury. We also discussed the possible inability to place a ureteral stent in which case, the next intervention recommended would be a percutaneous nephrostomy tube. Given infected obstruction is present, plan for emergent right ureteral stent placement. ? ?Patient aware given the size of her stone if we cannot place stent around stone  she will need emergent nephrostomy tube. Also discussed today's procedure is to decompression her kidney and she will need definitive stone management at a later date.  ? ?Recommendations: ?Plan for emergent  right ureteral stent placement. Risks, benefits, and expected peri-op course discussed with patient who wishes to proceed.  ?Case posted ?Keep patient NPO ?Consent obtained from patient at bedside  ?Hospitalist and intensivist notified by ED  ? ?Thank you for this consult. Please contact the urology consult pager with any further questions/concerns.  ?

## 2022-01-08 NOTE — Progress Notes (Signed)
eLink Physician-Brief Progress Note ?Patient Name: [name] ?DOB: 07/15/1961 ?MRN: [id number] ? ? ?Date of Service ? 01/08/2022  ?HPI/Events of Note ? Patient admited after finding renal stone and had a stent placed earlier today. Patient c/o 7/10 back pain and pain while urinating. Takes percocet at home. PO meds ok.   ?eICU Interventions ? Percocet ordered once.  ?  ? ? ? ?Intervention Category ?Intermediate Interventions: Other:;Pain - evaluation and management ? ?Elmer Sow ?01/08/2022, 10:53 PM ?

## 2022-01-08 NOTE — Op Note (Addendum)
PATIENT:  Alexa [name] ? ?Preoperative diagnosis:  ?Right infected obstructing stone  ? ?Postoperative diagnosis:  ?Right infected obstructing stone  ? ?Procedure: ? ?Cystoscopy ?Right ureteral stent placement (6Fr x 26cm) no string  ?Right retrograde pyelography with interpretation  ? ?Surgeon: Louis Meckel, M.D. ? ?Resident: Aldine Contes MD PGY4 ? ?Anesthesia: General ? ?Complications: None ? ?EBL: Minimal ? ?Specimens: None ? ?Indication: [name] is a 61 y.o. female with right 1.3cm UPJ stone with associated infection causing sepsis. After reviewing the management options for treatment, they have elected to proceed with the above surgical procedure(s). We have discussed the potential benefits and risks of the procedure, side effects of the proposed treatment, the likelihood of the patient achieving the goals of the procedure, and any potential problems that might occur during the procedure or recuperation. Informed consent has been obtained. ? ?Findings:  ?-Normal appearing urethra  ?-Bladder mucosa without lesions, masses, stones  ?-Orthotopic ureteral orifices  ?-Right retrograde pyelogram with filling defect at UPJ with mild proximal hydro ?-Successful right 6Fr x 26cm stent placement no dangler ?-Purulent efflux after stent placement  ?-Foley placed at end of the procedure  ? ?Description of procedure: ?  ?The patient was taken to the operating room and general anesthesia was induced.  The patient was placed in the dorsal lithotomy position, prepped and draped in the usual sterile fashion, and preoperative antibiotics were administered. A preoperative time-out was performed.  ? ?Cystourethroscopy was performed.  The patient?s urethra was examined and was normal. The bladder was then systematically examined in its entirety. There was no evidence for any bladder tumors, stones, or other mucosal pathology.   ? ?Attention then turned to the right ureteral orifice and a ureteral catheter was used to  intubate the ureteral orifice.  Omnipaque contrast was injected through the ureteral catheter and a retrograde pyelogram which revealed the above findings. ? ?A Sensor guidewire was then advanced up the right ureter into the renal pelvis under fluoroscopic guidance.  We then advanced a 6Fr x 26cm JJ stent over the wire under fluoroscopic and cystoscopic guidance.  The wire was then removed with an adequate stent curl noted in the upper pole as well as in the bladder. ? ?The bladder was left full and foley catheter placed.  The patient appeared to tolerate the procedure well and without complications.  The patient was able to be awakened and transferred to the recovery unit in satisfactory condition.  ? ?Plan:  ?-Admitted to ICU  ?  ?

## 2022-01-08 NOTE — Anesthesia Preprocedure Evaluation (Addendum)
Anesthesia Evaluation  ?Patient identified by MRN, date of birth, ID band ?Patient awake ? ? ? ?Reviewed: ?Allergy & Precautions, NPO status , Patient's Chart, lab work & pertinent test results ? ?History of Anesthesia Complications ?Negative for: history of anesthetic complications ? ?Airway ?Mallampati: II ? ?TM Distance: >3 FB ?Neck ROM: Full ? ? ? Dental ?no notable dental hx. ? ?  ?Pulmonary ?former smoker,  ?  ?Pulmonary exam normal ? ? ? ? ? ? ? Cardiovascular ?hypertension, Pt. on medications ?Normal cardiovascular exam ? ? ?  ?Neuro/Psych ?negative neurological ROS ? negative psych ROS  ? GI/Hepatic ?negative GI ROS, Neg liver ROS,   ?Endo/Other  ?Hypothyroidism Morbid obesity ? Renal/GU ?ARFRenal diseaseRenal stone, septic shock on norepi  ?negative genitourinary ?  ?Musculoskeletal ? ?(+) Arthritis ,  ? Abdominal ?  ?Peds ? Hematology ?negative hematology ROS ?(+)   ?Anesthesia Other Findings ?Day of surgery medications reviewed with patient. ? Reproductive/Obstetrics ?negative OB ROS ? ?  ? ? ? ? ? ? ? ? ? ? ? ? ? ?  ?  ? ? ? ? ? ? ? ?Anesthesia Physical ?Anesthesia Plan ? ?ASA: 3 and emergent ? ?Anesthesia Plan: General  ? ?Post-op Pain Management: Ofirmev IV (intra-op)*  ? ?Induction: Intravenous and Rapid sequence ? ?PONV Risk Score and Plan: 4 or greater and Treatment may vary due to age or medical condition, Midazolam, Ondansetron and Dexamethasone ? ?Airway Management Planned: Oral ETT and Video Laryngoscope Planned ? ?Additional Equipment:  ? ?Intra-op Plan:  ? ?Post-operative Plan: Possible Post-op intubation/ventilation ? ?Informed Consent: I have reviewed the patients History and Physical, chart, labs and discussed the procedure including the risks, benefits and alternatives for the proposed anesthesia with the patient or authorized representative who has indicated his/her understanding and acceptance.  ? ? ? ?Dental advisory given ? ?Plan Discussed with:  CRNA ? ?Anesthesia Plan Comments:   ? ? ? ? ? ?Anesthesia Quick Evaluation ? ?

## 2022-01-09 ENCOUNTER — Encounter (HOSPITAL_COMMUNITY): Payer: Self-pay | Admitting: Urology

## 2022-01-09 ENCOUNTER — Encounter (HOSPITAL_COMMUNITY)
Admission: RE | Admit: 2022-01-09 | Discharge: 2022-01-09 | Disposition: A | Discharge status: Home / Self Care | Payer: 59 | Source: Ambulatory Visit | Attending: Urology | Admitting: Urology

## 2022-01-09 DIAGNOSIS — Z87442 Personal history of urinary calculi: Secondary | ICD-10-CM | POA: Diagnosis not present

## 2022-01-09 DIAGNOSIS — N179 Acute kidney failure, unspecified: Secondary | ICD-10-CM

## 2022-01-09 DIAGNOSIS — A4151 Sepsis due to Escherichia coli [E. coli]: Secondary | ICD-10-CM | POA: Diagnosis present

## 2022-01-09 DIAGNOSIS — N200 Calculus of kidney: Secondary | ICD-10-CM

## 2022-01-09 LAB — CBC
HCT: 32.2 % — ABNORMAL LOW (ref 36.0–46.0)
HCT: 37.8 % (ref 36.0–46.0)
Hemoglobin: 10.7 g/dL — ABNORMAL LOW (ref 12.0–15.0)
Hemoglobin: 12 g/dL (ref 12.0–15.0)
MCH: 29.2 pg (ref 26.0–34.0)
MCH: 28.2 pg (ref 26.0–34.0)
MCHC: 33.2 g/dL (ref 30.0–36.0)
MCHC: 31.7 g/dL (ref 30.0–36.0)
MCV: 88 fL (ref 80.0–100.0)
MCV: 88.9 fL (ref 80.0–100.0)
Platelets: 117 10*3/uL — ABNORMAL LOW (ref 150–400)
Platelets: 174 10*3/uL (ref 150–400)
RBC: 3.66 MIL/uL — ABNORMAL LOW (ref 3.87–5.11)
RBC: 4.25 MIL/uL (ref 3.87–5.11)
RDW: 15.2 % (ref 11.5–15.5)
RDW: 15.1 % (ref 11.5–15.5)
WBC: 10.8 10*3/uL — ABNORMAL HIGH (ref 4.0–10.5)
WBC: 14.5 10*3/uL — ABNORMAL HIGH (ref 4.0–10.5)
nRBC: 0 % (ref 0.0–0.2)
nRBC: 0.1 % (ref 0.0–0.2)

## 2022-01-09 LAB — TSH: TSH: 2.262 u[IU]/mL (ref 0.350–4.500)

## 2022-01-09 LAB — COMPREHENSIVE METABOLIC PANEL
ALT: 18 U/L (ref 0–44)
AST: 22 U/L (ref 15–41)
Albumin: 2.6 g/dL — ABNORMAL LOW (ref 3.5–5.0)
Alkaline Phosphatase: 86 U/L (ref 38–126)
Anion gap: 8 (ref 5–15)
BUN: 48 mg/dL — ABNORMAL HIGH (ref 8–23)
CO2: 22 mmol/L (ref 22–32)
Calcium: 8.1 mg/dL — ABNORMAL LOW (ref 8.9–10.3)
Chloride: 108 mmol/L (ref 98–111)
Creatinine, Ser: 2.13 mg/dL — ABNORMAL HIGH (ref 0.44–1.00)
GFR, Estimated: 26 mL/min — ABNORMAL LOW (ref >60)
Glucose, Bld: 155 mg/dL — ABNORMAL HIGH (ref 70–99)
Potassium: 4.2 mmol/L (ref 3.5–5.1)
Sodium: 138 mmol/L (ref 135–145)
Total Bilirubin: 0.6 mg/dL (ref 0.3–1.2)
Total Protein: 6.1 g/dL — ABNORMAL LOW (ref 6.5–8.1)

## 2022-01-09 MED ORDER — SODIUM CHLORIDE 0.9 % IV SOLN: 250.0000 mL | INTRAVENOUS | Status: DC

## 2022-01-09 MED ORDER — OXYCODONE-ACETAMINOPHEN 5-325 MG PO TABS
1.0000 | ORAL_TABLET | ORAL | Status: DC | PRN
  Administered 2022-01-09 – 2022-01-10 (×2): 1 via ORAL
  Filled 2022-01-09 (×4): qty 1

## 2022-01-09 MED ORDER — FENTANYL CITRATE PF 50 MCG/ML IJ SOSY
PREFILLED_SYRINGE | INTRAMUSCULAR | Status: AC
  Filled 2022-01-09: qty 2

## 2022-01-09 MED ORDER — OXYCODONE HCL 5 MG PO TABS
ORAL_TABLET | ORAL | Status: AC
  Filled 2022-01-09: qty 1

## 2022-01-09 MED ORDER — METHOCARBAMOL 500 MG IVPB - SIMPLE MED
INTRAVENOUS | Status: AC
  Filled 2022-01-09: qty 50

## 2022-01-09 MED ORDER — OXYCODONE-ACETAMINOPHEN 5-325 MG PO TABS
1.0000 | ORAL_TABLET | Freq: Four times a day (QID) | ORAL | Status: DC | PRN
  Administered 2022-01-09: 1 via ORAL
  Filled 2022-01-09: qty 1

## 2022-01-09 MED ORDER — SODIUM CHLORIDE 0.9 % IV SOLN: INTRAVENOUS | Status: AC

## 2022-01-09 MED ORDER — FAMOTIDINE 20 MG PO TABS
20.0000 mg | ORAL_TABLET | Freq: Two times a day (BID) | ORAL | Status: DC
Start: 2022-01-09 — End: 2022-01-11
  Administered 2022-01-10 – 2022-01-11 (×3): 20 mg via ORAL
  Filled 2022-01-09 (×4): qty 1

## 2022-01-09 NOTE — Hospital Course (Addendum)
Patient is a 61 year old female with history of kidney stones and hypothyroidism presented to [facility] initially on 01/07/2022 with nausea, vomiting and abdominal pain.  Patient was noted to be hypotensive.  Of note, patient has a history of a history of a 13 mm renal stone that was confirmed on CT scan on 2/24.  Patient was supposed to receive lithotripsy on 01/03/22 but this got delayed due to patient taking NSAIDs.  Lithotripsy was rescheduled for 01/10/22.  Patient had came in to [facility] ED on 01/04/22 due to increased right flank pain.  Patient had denied CT scan at that time and UA was concerning for UTI so was given antibiotic course and was discharged home.  Patient was then readmitted on 01/07/2022 with symptoms including fever and hypotension.  Initial labs showed acute kidney injury.  Patient was given 3 L of IV fluid and was admitted to ICU for septic shock.   ? ?Assessment and plan ?Principal Problem: ?  Sepsis due to Escherichia coli (E. coli) ([place]) ?Active Problems: ?  Nephrolithiasis ?  Septic shock ([place]) ?  History of kidney stones ?  AKI (acute kidney injury) ([place]) ?  ?Septic Shock secondary to Impacted and infected right renal calculus with hydronephrosis ?Status post ureteroscopy and stent placement on 01/08/2022, patient received IV Rocephin during hospitalization.   Patient will need to follow-up with urology, [place] as outpatient for definite stone management.  She had leukocytosis on presentation which has resolved at this time.  Urine culture with E. coli resistant to fluoroquinolones but sensitive to all of the other antibiotic.  We will continue cefadroxil on discharge to complete the course.  Lactate was elevated to 3.6 on presentation.  This has improved at this time.  Patient will be given flavoxate for urinary spasms on discharge. ? ?AKI ?Creatinine  2.0 from 2.1.  Received normal saline during hospitalization.  Will need outpatient follow-up with PCP. ? ?Elevated Alk Phosph ?Monitor CMP  as outpatient. ?  ?Thrombocytopenia ?Mild.  Latest platelet count of 134. ?  ?Hx of asthma:  ?Compensated. ?  ?Hx of HTN ?Not on medication at home. ? ?Hx of hypothyroidism ?Continue Synthroid.  TSH of 2.2. ? ?Morbid obesity. ?Body mass index is 52.14 kg/m?. ?patient would benefit from weight loss as outpatient. ? ?Weakness deconditioning.  Patient was able to ambulate during hospitalization. ?

## 2022-01-09 NOTE — Plan of Care (Signed)
  Problem: Activity: Goal: Risk for activity intolerance will decrease Outcome: Progressing   Problem: Nutrition: Goal: Adequate nutrition will be maintained Outcome: Progressing   Problem: Safety: Goal: Ability to remain free from injury will improve Outcome: Progressing   

## 2022-01-09 NOTE — Progress Notes (Signed)
Transition of Care (TOC) Screening Note ? ?Patient Details  ?Name: Alexa [name] ?Date of Birth: [birth year] ? ?Transition of Care (TOC) CM/SW Contact:    ?Sherie Don, LCSW ?Phone Number: ?01/09/2022, 10:18 AM ? ?Transition of Care Department [facility]) has reviewed patient and no TOC needs have been identified at this time. We will continue to monitor patient advancement through interdisciplinary progression rounds. If new patient transition needs arise, please place a TOC consult. ?

## 2022-01-09 NOTE — Progress Notes (Signed)
?PROGRESS NOTE ? ? ? ?Alexa [name]  XKG:[id number] DOB: 08-23-1961 DOA: 01/07/2022 ?PCP: Celene Squibb, MD  ? ? ?Brief Narrative:  ?Patient is a 61 year old female with history of kidney stones and hypothyroidism presented to [facility] initially on 01/07/2022 with nausea vomiting abdominal pain.  Patient was noted to be hypotensive.  Of note, patient has a history of a history of a 13 mm renal stone that was confirmed on CT scan on 2/24.  Patient was supposed to receive lithotripsy on 5/2 but this got delayed due to patient taking NSAIDs.  Lithotripsy was rescheduled for 5/9.  Patient came in to [facility] ED on 5/3 due to increased right flank pain.  He had denied CT scan at that time and UA was concerning for UTI so was given antibiotic course and was discharged home.  Patient was then readmitted on 01/07/2022 with symptoms including fever and hypotension.  Initial labs showed acute kidney injury.  Patient was given 3 L of IV fluid and was admitted to ICU for septic shock.   ? ?Assessment and plan ?Principal Problem: ?  Sepsis due to Escherichia coli (E. coli) ([place]) ?Active Problems: ?  Nephrolithiasis ?  Septic shock ([place]) ?  History of kidney stones ?  AKI (acute kidney injury) ([place]) ?  ?Septic Shock secondary to Impacted and infected right renal calculus with hydronephrosis ?Status post ureteroscopy and stent placement on 01/08/2022 continue antibiotic.  Patient will need to follow-up with urology [place] as outpatient for definite stone management.  WBC is trending down.  Urine culture with E. coli.  Lactate was elevated to 3.6 on presentation.  This has improved at this time.  We will continue to follow urine cultures. ?  ?AKI ?Creatinine  2.1 today.  We will continue to monitor.  Improved from creatinine of 2.7.  We will continue to monitor renal function.  We will continue normal saline 100 MLS per hour for 1 more day. ?  ?Elevated Alk Phosph ?Monitor CMP. ?  ?Thrombocytopenia ?Mild.  Secondary to sepsis.   Closely monitor CBC. ?  ?Hx of asthma:  ?on symbicort at home.  Continue inhalers. ?  ?Hx of HTN ?Not on medication ? ?Hx of hypothyroidism ?Continue Synthroid.  TSH of 2.2. ? ?Morbid obesity. ?Body mass index is 52.14 kg/m?. ?patient would benefit from weight loss as outpatient. ? ?Weakness deconditioning.  We will ambulate patient in hallway.  Get PT evaluation if necessary ?  ?  ? DVT prophylaxis: heparin injection 5,000 Units Start: 01/08/22 0600 ?SCDs Start: 01/08/22 0034 ? ? ?Code Status:   ?  Code Status: Full Code ? ?Disposition: Home likely in 1 to 2 days ? ?Status is: Inpatient ? ?Remains inpatient appropriate because: Acute kidney injury, E. coli sepsis, IV antibiotic, ? ? Family Communication: Spoke with the patient at bedside. ? ?Consultants:  ?Urology ? ?Procedures:  ?Ureteroscopy and stent placement on 01/08/2022 ? ?Antimicrobials:  ?Rocephin IV ? ?Anti-infectives (From admission, onward)  ? ? Start     Dose/Rate Route Frequency Ordered Stop  ? 01/08/22 2200  cefTRIAXone (ROCEPHIN) 2 g in sodium chloride 0.9 % 100 mL IVPB       ? 2 g ?200 mL/hr over 30 Minutes Intravenous Every 24 hours 01/08/22 1803    ? 01/07/22 2115  cefTRIAXone (ROCEPHIN) 2 g in sodium chloride 0.9 % 100 mL IVPB  Status:  Discontinued       ? 2 g ?200 mL/hr over 30 Minutes Intravenous Every 24 hours  01/07/22 2111 01/08/22 1803  ? ?  ? ?Subjective: ?Today, patient was seen and examined at bedside.  Complains of bladder spasms on and off urine with some hematuria.  Denies any fever chills nausea vomiting. ? ?Objective: ?Vitals:  ? 01/08/22 1402 01/09/22 0032 01/09/22 0500 01/09/22 0535  ?BP: (!) 151/96 123/69  (!) 158/85  ?Pulse: 73 65  61  ?Resp: $Remov'18 20  19  'fQTOuD$ ?Temp: 98.2 ?F (36.8 ?C) (!) 97.4 ?F (36.3 ?C)  (!) 97.5 ?F (36.4 ?C)  ?TempSrc: Oral Oral  Oral  ?SpO2: 99% 98%  99%  ?Weight:   (!) 151 kg   ?Height:      ? ? ?Intake/Output Summary (Last 24 hours) at 01/09/2022 1243 ?Last data filed at 01/09/2022 6283 ?Gross per 24 hour  ?Intake  575.38 ml  ?Output 750 ml  ?Net -174.62 ml  ? ?Filed Weights  ? 01/07/22 2051 01/08/22 0215 01/09/22 0500  ?Weight: 136.1 kg (!) 149 kg (!) 151 kg  ? ?Body mass index is 52.14 kg/m?Marland Kitchen  ?Physical Examination: ? ?General: Morbidly obese built, not in obvious distress ?HENT:   No scleral pallor or icterus noted. Oral mucosa is moist.  ?Chest:  Clear breath sounds.  Diminished breath sounds bilaterally. No crackles or wheezes.  ?CVS: S1 &S2 heard. No murmur.  Regular rate and rhythm. ?Abdomen: Soft, nontender, nondistended.  Bowel sounds are heard.   ?Extremities: No cyanosis, clubbing or edema.  Peripheral pulses are palpable. ?Psych: Alert, awake and oriented, normal mood ?CNS:  No cranial nerve deficits.  Power equal in all extremities.   ?Skin: Warm and dry.  No rashes noted. ? ?Data Reviewed:  ? ?CBC: ?Recent Labs  ?Lab 01/04/22 ?0025 01/07/22 ?2047 01/08/22 ?0157 01/08/22 ?1517 01/09/22 ?0327  ?WBC 9.3 7.1 16.8* 12.3* 10.8*  ?NEUTROABS 7.9* 6.1  --   --   --   ?HGB 12.3 12.0 10.1* 10.6* 10.7*  ?HCT 37.1 35.9* 30.9* 32.4* 32.2*  ?MCV 87.5 86.7 87.5 88.0 88.0  ?PLT 138* 121* 110* 103* 117*  ? ? ?Basic Metabolic Panel: ?Recent Labs  ?Lab 01/04/22 ?0025 01/07/22 ?2047 01/08/22 ?0157 01/08/22 ?6160 01/09/22 ?0327  ?NA 138 135  --  137 138  ?K 3.3* 3.0*  --  3.9 4.2  ?CL 106 101  --  107 108  ?CO2 21* 24  --  21* 22  ?GLUCOSE 143* 168*  --  167* 155*  ?BUN 18 43*  --  40* 48*  ?CREATININE 1.39* 2.71* 2.74* 2.52* 2.13*  ?CALCIUM 8.6* 8.3*  --  7.7* 8.1*  ?MG  --   --   --  1.8  --   ?PHOS  --   --   --  2.7  --   ? ? ?Liver Function Tests: ?Recent Labs  ?Lab 01/07/22 ?2047 01/09/22 ?0327  ?AST 31 22  ?ALT 19 18  ?ALKPHOS 295* 86  ?BILITOT 1.4* 0.6  ?PROT 6.7 6.1*  ?ALBUMIN 2.7* 2.6*  ? ? ? ?Radiology Studies: ?DG Abd 1 View ? ?Result Date: 01/08/2022 ?CLINICAL DATA:  Nephrolithiasis. EXAM: ABDOMEN - 1 VIEW COMPARISON:  None Available. FINDINGS: Technically limited by large patient habitus. Double pigtail right ureteral  stent is seen in place. No large radiopaque calculi identified, although sensitivity limited by large patient habitus. Right upper quadrant surgical clips are seen from prior cholecystectomy. The bowel gas pattern is normal. IMPRESSION: Technically limited by large habitus. Right ureteral stent in place. No acute findings. Electronically Signed   By: Marlaine Hind  M.D.   On: 01/08/2022 10:14  ? ?DG Chest Port 1 View ? ?Result Date: 01/07/2022 ?CLINICAL DATA:  Sepsis EXAM: PORTABLE CHEST 1 VIEW COMPARISON:  None Available. FINDINGS: The heart size and mediastinal contours are within normal limits. Both lungs are clear. The visualized skeletal structures are unremarkable. IMPRESSION: No active disease. Electronically Signed   By: Ulyses Jarred M.D.   On: 01/07/2022 21:56  ? ?DG C-Arm 1-60 Min-No Report ? ?Result Date: 01/08/2022 ?Fluoroscopy was utilized by the requesting physician.  No radiographic interpretation.   ? ? ? LOS: 1 day  ? ? ?Flora Lipps, MD ?[facility] ?Available via Epic secure chat 7am-7pm ?After these hours, please refer to coverage provider listed on [email or web address] ?01/09/2022, 12:43 PM  ?  ?

## 2022-01-09 NOTE — Anesthesia Postprocedure Evaluation (Signed)
Anesthesia Post Note ? ?Patient: Alexa [name] ? ?Procedure(s) Performed: CYSTOSCOPY WITH RETROGRADE PYELOGRAM, URETEROSCOPY AND STENT PLACEMENT (Right: Ureter) ? ?  ? ?Patient location during evaluation: PACU ?Anesthesia Type: General ?Level of consciousness: awake and alert ?Pain management: pain level controlled ?Vital Signs Assessment: post-procedure vital signs reviewed and stable ?Respiratory status: spontaneous breathing, nonlabored ventilation and respiratory function stable ?Cardiovascular status: blood pressure returned to baseline ?Postop Assessment: no apparent nausea or vomiting ?Anesthetic complications: no ? ? ?No notable events documented. ? ?  ? ?Marthenia Rolling ? ? ? ? ?

## 2022-01-10 ENCOUNTER — Ambulatory Visit: Payer: 59 | Admitting: Urology

## 2022-01-10 ENCOUNTER — Encounter (HOSPITAL_COMMUNITY): Admission: RE | Payer: Self-pay | Source: Home / Self Care

## 2022-01-10 ENCOUNTER — Ambulatory Visit (HOSPITAL_COMMUNITY): Admission: RE | Admit: 2022-01-10 | Payer: 59 | Source: Home / Self Care | Admitting: Urology

## 2022-01-10 DIAGNOSIS — N200 Calculus of kidney: Secondary | ICD-10-CM | POA: Diagnosis not present

## 2022-01-10 DIAGNOSIS — N179 Acute kidney failure, unspecified: Secondary | ICD-10-CM | POA: Diagnosis not present

## 2022-01-10 DIAGNOSIS — A4151 Sepsis due to Escherichia coli [E. coli]: Secondary | ICD-10-CM | POA: Diagnosis not present

## 2022-01-10 DIAGNOSIS — Z87442 Personal history of urinary calculi: Secondary | ICD-10-CM | POA: Diagnosis not present

## 2022-01-10 LAB — BASIC METABOLIC PANEL
Anion gap: 7 (ref 5–15)
BUN: 48 mg/dL — ABNORMAL HIGH (ref 8–23)
CO2: 22 mmol/L (ref 22–32)
Calcium: 7.6 mg/dL — ABNORMAL LOW (ref 8.9–10.3)
Chloride: 111 mmol/L (ref 98–111)
Creatinine, Ser: 2.04 mg/dL — ABNORMAL HIGH (ref 0.44–1.00)
GFR, Estimated: 27 mL/min — ABNORMAL LOW (ref >60)
Glucose, Bld: 116 mg/dL — ABNORMAL HIGH (ref 70–99)
Potassium: 5 mmol/L (ref 3.5–5.1)
Sodium: 140 mmol/L (ref 135–145)

## 2022-01-10 LAB — CBC
HCT: 37.3 % (ref 36.0–46.0)
Hemoglobin: 12 g/dL (ref 12.0–15.0)
MCH: 29.2 pg (ref 26.0–34.0)
MCHC: 32.2 g/dL (ref 30.0–36.0)
MCV: 90.8 fL (ref 80.0–100.0)
Platelets: 134 10*3/uL — ABNORMAL LOW (ref 150–400)
RBC: 4.11 MIL/uL (ref 3.87–5.11)
RDW: 15.1 % (ref 11.5–15.5)
WBC: 9.4 10*3/uL (ref 4.0–10.5)
nRBC: 0 % (ref 0.0–0.2)

## 2022-01-10 LAB — URINE CULTURE: Culture: >=100000 — AB

## 2022-01-10 LAB — CULTURE, BLOOD (ROUTINE X 2): Special Requests: ADEQUATE

## 2022-01-10 LAB — MAGNESIUM: Magnesium: 2 mg/dL (ref 1.7–2.4)

## 2022-01-10 SURGERY — LITHOTRIPSY, ESWL
Anesthesia: LOCAL | Laterality: Right

## 2022-01-10 MED ORDER — PHENAZOPYRIDINE HCL 100 MG PO TABS: 100.0000 mg | ORAL_TABLET | Freq: Three times a day (TID) | ORAL | Status: DC

## 2022-01-10 MED ORDER — FLAVOXATE HCL 100 MG PO TABS
100.0000 mg | ORAL_TABLET | Freq: Three times a day (TID) | ORAL | Status: DC | PRN
  Filled 2022-01-10: qty 1

## 2022-01-10 NOTE — Plan of Care (Signed)
  Problem: Activity: Goal: Risk for activity intolerance will decrease Outcome: Progressing   Problem: Pain Managment: Goal: General experience of comfort will improve Outcome: Progressing   Problem: Safety: Goal: Ability to remain free from injury will improve Outcome: Progressing   

## 2022-01-10 NOTE — Progress Notes (Addendum)
?PROGRESS NOTE ? ? ? ?Alexa [name]  WER:[id number] DOB: [birth year] DOA: 01/07/2022 ?PCP: Celene Squibb, MD  ? ? ?Brief Narrative:  ?Patient is a 61 year old female with history of kidney stones and hypothyroidism presented to [facility] initially on 01/07/2022 with nausea, vomiting and abdominal pain.  Patient was noted to be hypotensive.  Of note, patient has a history of a history of a 13 mm renal stone that was confirmed on CT scan on 2/24.  Patient was supposed to receive lithotripsy on 01/03/22 but this got delayed due to patient taking NSAIDs.  Lithotripsy was rescheduled for 01/10/22.  Patient had came in to [facility] ED on 01/04/22 due to increased right flank pain.  Patient had denied CT scan at that time and UA was concerning for UTI so was given antibiotic course and was discharged home.  Patient was then readmitted on 01/07/2022 with symptoms including fever and hypotension.  Initial labs showed acute kidney injury.  Patient was given 3 L of IV fluid and was admitted to ICU for septic shock.   ? ?Assessment and plan ?Principal Problem: ?  Sepsis due to Escherichia coli (E. coli) ([place]) ?Active Problems: ?  Nephrolithiasis ?  Septic shock ([place]) ?  History of kidney stones ?  AKI (acute kidney injury) ([place]) ?  ?Septic Shock secondary to Impacted and infected right renal calculus with hydronephrosis ?Status post ureteroscopy and stent placement on 01/08/2022,  continue IV antibiotic.  Patient will need to follow-up with urology, [place] as outpatient for definite stone management.  WBC is trending down.  Urine culture with E. coli.  Lactate was elevated to 3.6 on presentation.  This has improved at this time.  We will continue to follow urine cultures.  Patient continues to complain of urinary spasms and loin pain.  Add Flavoxate. ?  ?AKI ?Creatinine  2.0 from 2.1.  on normal saline 100 MLS per hour, to complete the IV fluids today. ?  ?Elevated Alk Phosph ?Monitor CMP. ?  ?Thrombocytopenia ?Mild.  Secondary to  sepsis.  Improved.  Latest platelet count of 174 ?  ?Hx of asthma:  ?on symbicort at home.  Continue inhalers. ?  ?Hx of HTN ?Not on medication at home. ? ?Hx of hypothyroidism ?Continue Synthroid.  TSH of 2.2. ? ?Morbid obesity. ?Body mass index is 52.14 kg/m?. ?patient would benefit from weight loss as outpatient. ? ?Weakness deconditioning.  Ambulate as tolerated ?  ?  ? DVT prophylaxis: heparin injection 5,000 Units Start: 01/08/22 0600 ?SCDs Start: 01/08/22 0034 ? ? ?Code Status:   ?  Code Status: Full Code ? ?Disposition: Home likely in 1 to 2 days ? ?Status is: Inpatient ? ?Remains inpatient appropriate because: Acute kidney injury, E. coli sepsis, IV antibiotic, follow cultures. ? ? Family Communication: Spoke with the patient at bedside. ? ?Consultants:  ?Urology ? ?Procedures:  ?Ureteroscopy and stent placement on 01/08/2022 ? ?Antimicrobials:  ?Rocephin IV ? ?Anti-infectives (From admission, onward)  ? ? Start     Dose/Rate Route Frequency Ordered Stop  ? 01/08/22 2200  cefTRIAXone (ROCEPHIN) 2 g in sodium chloride 0.9 % 100 mL IVPB       ? 2 g ?200 mL/hr over 30 Minutes Intravenous Every 24 hours 01/08/22 1803    ? 01/07/22 2115  cefTRIAXone (ROCEPHIN) 2 g in sodium chloride 0.9 % 100 mL IVPB  Status:  Discontinued       ? 2 g ?200 mL/hr over 30 Minutes Intravenous Every 24 hours 01/07/22 2111  01/08/22 1803  ? ?  ? ?Subjective: ?Today, patient was seen and examined at bedside.  Still complains of back pain on and off with some spasm radiating.  Denies any fever but had some chills yesterday.  Denies nausea vomiting.  Complains of frequent urination.  She was able to shower and go to the bathroom yesterday. ? ?Objective: ?Vitals:  ? 01/09/22 1836 01/09/22 2136 01/10/22 0500 01/10/22 0517  ?BP: (!) 139/94 (!) 119/51  136/82  ?Pulse: 71 87  66  ?Resp:  18  18  ?Temp: 97.8 ?F (36.6 ?C) 98.2 ?F (36.8 ?C)  97.9 ?F (36.6 ?C)  ?TempSrc:  Oral  Oral  ?SpO2: 100% 99%  100%  ?Weight:   (!) 150.1 kg   ?Height:       ? ? ?Intake/Output Summary (Last 24 hours) at 01/10/2022 1305 ?Last data filed at 01/10/2022 0518 ?Gross per 24 hour  ?Intake 1611.67 ml  ?Output 1050 ml  ?Net 561.67 ml  ? ?Filed Weights  ? 01/08/22 0215 01/09/22 0500 01/10/22 0500  ?Weight: (!) 149 kg (!) 151 kg (!) 150.1 kg  ? ?Body mass index is 51.83 kg/m?.  ? ?Physical Examination: ? ?General: Morbidly obese built, not in obvious distress ?HENT:   No scleral pallor or icterus noted. Oral mucosa is moist.  ?Chest:  Clear breath sounds.  Diminished breath sounds bilaterally. No crackles or wheezes.  ?CVS: S1 &S2 heard. No murmur.  Regular rate and rhythm. ?Abdomen: Soft, nontender, nondistended.  Bowel sounds are heard.   ?Extremities: No cyanosis, clubbing or edema.  Peripheral pulses are palpable. ?Psych: Alert, awake and oriented, normal mood ?CNS:  No cranial nerve deficits.  Power equal in all extremities.   ?Skin: Warm and dry.  No rashes noted. ? ?Data Reviewed:  ? ?CBC: ?Recent Labs  ?Lab 01/04/22 ?0025 01/07/22 ?2047 01/08/22 ?0157 01/08/22 ?2446 01/09/22 ?0327 01/09/22 ?1251  ?WBC 9.3 7.1 16.8* 12.3* 10.8* 14.5*  ?NEUTROABS 7.9* 6.1  --   --   --   --   ?HGB 12.3 12.0 10.1* 10.6* 10.7* 12.0  ?HCT 37.1 35.9* 30.9* 32.4* 32.2* 37.8  ?MCV 87.5 86.7 87.5 88.0 88.0 88.9  ?PLT 138* 121* 110* 103* 117* 174  ? ? ?Basic Metabolic Panel: ?Recent Labs  ?Lab 01/04/22 ?0025 01/07/22 ?2047 01/08/22 ?0157 01/08/22 ?9507 01/09/22 ?0327 01/10/22 ?0340  ?NA 138 135  --  137 138 140  ?K 3.3* 3.0*  --  3.9 4.2 5.0  ?CL 106 101  --  107 108 111  ?CO2 21* 24  --  21* 22 22  ?GLUCOSE 143* 168*  --  167* 155* 116*  ?BUN 18 43*  --  40* 48* 48*  ?CREATININE 1.39* 2.71* 2.74* 2.52* 2.13* 2.04*  ?CALCIUM 8.6* 8.3*  --  7.7* 8.1* 7.6*  ?MG  --   --   --  1.8  --  2.0  ?PHOS  --   --   --  2.7  --   --   ? ? ?Liver Function Tests: ?Recent Labs  ?Lab 01/07/22 ?2047 01/09/22 ?0327  ?AST 31 22  ?ALT 19 18  ?ALKPHOS 295* 86  ?BILITOT 1.4* 0.6  ?PROT 6.7 6.1*  ?ALBUMIN 2.7* 2.6*   ? ? ? ?Radiology Studies: ?No results found. ? ? ? LOS: 2 days  ? ? ?Flora Lipps, MD ?[facility] ?Available via Epic secure chat 7am-7pm ?After these hours, please refer to coverage provider listed on [email or web address] ?01/10/2022, 1:05 PM  ?  ?

## 2022-01-10 NOTE — Progress Notes (Signed)
Notified MD aerobic blood culture result positive for gram neg rods. ?

## 2022-01-11 DIAGNOSIS — Z87442 Personal history of urinary calculi: Secondary | ICD-10-CM | POA: Diagnosis not present

## 2022-01-11 DIAGNOSIS — A4151 Sepsis due to Escherichia coli [E. coli]: Secondary | ICD-10-CM | POA: Diagnosis not present

## 2022-01-11 DIAGNOSIS — N200 Calculus of kidney: Secondary | ICD-10-CM | POA: Diagnosis not present

## 2022-01-11 DIAGNOSIS — N179 Acute kidney failure, unspecified: Secondary | ICD-10-CM | POA: Diagnosis not present

## 2022-01-11 MED ORDER — FAMOTIDINE 20 MG PO TABS: 20.0000 mg | ORAL_TABLET | Freq: Two times a day (BID) | ORAL | 0 refills | Status: AC

## 2022-01-11 MED ORDER — FLAVOXATE HCL 100 MG PO TABS: 100.0000 mg | ORAL_TABLET | Freq: Three times a day (TID) | ORAL | 0 refills | Status: DC | PRN

## 2022-01-11 MED ORDER — CEFADROXIL 500 MG PO CAPS: 1000.0000 mg | ORAL_CAPSULE | Freq: Two times a day (BID) | ORAL | 0 refills | Status: AC

## 2022-01-11 MED ORDER — CEFDINIR 300 MG PO CAPS: 300.0000 mg | ORAL_CAPSULE | Freq: Two times a day (BID) | ORAL | 0 refills | Status: DC

## 2022-01-11 NOTE — Discharge Summary (Signed)
?Physician Discharge Summary ?  ?Patient: Alexa [name] MRN: [id number] DOB: [birth year]  ?Admit date:     01/07/2022  ?Discharge date: 01/11/22  ?Discharge Physician: Corrie Mckusick Catalino Plascencia  ? ?PCP: Celene Squibb, MD  ? ?Recommendations at discharge:  ? ?Follow-up with your primary care provider in 1 week. ?Follow-up with urology as outpatient for definite treatment of stone. ? ?Discharge Diagnoses: ?Active Problems: ?  Nephrolithiasis ?  History of kidney stones ?  AKI (acute kidney injury) ([place]) ? ?Principal Problem (Resolved): ?  Sepsis due to Escherichia coli (E. coli) ([place]) ?Resolved Problems: ?  Septic shock ([place]) ? ?Hospital Course: ?Patient is a 61 year old female with history of kidney stones and hypothyroidism presented to [facility] initially on 01/07/2022 with nausea, vomiting and abdominal pain.  Patient was noted to be hypotensive.  Of note, patient has a history of a history of a 13 mm renal stone that was confirmed on CT scan on 2/24.  Patient was supposed to receive lithotripsy on 01/03/22 but this got delayed due to patient taking NSAIDs.  Lithotripsy was rescheduled for 01/10/22.  Patient had came in to [facility] ED on 01/04/22 due to increased right flank pain.  Patient had denied CT scan at that time and UA was concerning for UTI so was given antibiotic course and was discharged home.  Patient was then readmitted on 01/07/2022 with symptoms including fever and hypotension.  Initial labs showed acute kidney injury.  Patient was given 3 L of IV fluid and was admitted to ICU for septic shock.   ? ?Assessment and plan ?Principal Problem: ?  Sepsis due to Escherichia coli (E. coli) ([place]) ?Active Problems: ?  Nephrolithiasis ?  Septic shock ([place]) ?  History of kidney stones ?  AKI (acute kidney injury) ([place]) ?  ?Septic Shock secondary to Impacted and infected right renal calculus with hydronephrosis ?Status post ureteroscopy and stent placement on 01/08/2022, patient received IV Rocephin during hospitalization.    Patient will need to follow-up with urology, [place] as outpatient for definite stone management.  She had leukocytosis on presentation which has resolved at this time.  Urine culture with E. coli resistant to fluoroquinolones but sensitive to all of the other antibiotic.  We will continue cefadroxil on discharge to complete the course.  Lactate was elevated to 3.6 on presentation.  This has improved at this time.  Patient will be given flavoxate for urinary spasms on discharge. ? ?AKI ?Creatinine  2.0 from 2.1.  Received normal saline during hospitalization.  Will need outpatient follow-up with PCP. ? ?Elevated Alk Phosph ?Monitor CMP as outpatient. ?  ?Thrombocytopenia ?Mild.  Latest platelet count of 134. ?  ?Hx of asthma:  ?Compensated. ?  ?Hx of HTN ?Not on medication at home. ? ?Hx of hypothyroidism ?Continue Synthroid.  TSH of 2.2. ? ?Morbid obesity. ?Body mass index is 52.14 kg/m?. ?patient would benefit from weight loss as outpatient. ? ?Weakness deconditioning.  Patient was able to ambulate during hospitalization. ? ?Consultants: Urology ? ?Procedures performed: Ureteroscopy and stent placement on 01/08/2022  ? ?Disposition: Home ? ?Diet recommendation:  ?Discharge Diet Orders (From admission, onward)  ? ?  Start     Ordered  ? 01/11/22 0000  Diet general       ? 01/11/22 0815  ? ?  ?  ? ?  ? ?Regular diet ?DISCHARGE MEDICATION: ?Allergies as of 01/11/2022   ? ?   Reactions  ? Codeine Nausea Only  ? ?  ? ?  ?  Medication List  ?  ? ?STOP taking these medications   ? ?oxyCODONE-acetaminophen 5-325 MG tablet ?Commonly known as: Percocet ?  ? ?  ? ?TAKE these medications   ? ?cefadroxil 500 MG capsule ?Commonly known as: DURICEF ?Take 2 capsules (1,000 mg total) by mouth 2 (two) times daily for 7 days. ?  ?famotidine 20 MG tablet ?Commonly known as: PEPCID ?Take 1 tablet (20 mg total) by mouth 2 (two) times daily. ?  ?flavoxATE 100 MG tablet ?Commonly known as: URISPAS ?Take 1 tablet (100 mg total) by mouth 3  (three) times daily as needed for bladder spasms. ?  ?levothyroxine 150 MCG tablet ?Commonly known as: SYNTHROID ?Take 150 mcg by mouth daily before breakfast. ?  ? ?  ? ? Follow-up Information   ? ? Celene Squibb, MD Follow up in 1 week(s).   ?Specialty: Internal Medicine ?Contact information: ?[address] ?[place] ?[place] [address] ?[phone] ? ? ?  ?  ? ? Stoneking, Reece Leader., MD Follow up in 1 week(s).   ?Specialty: Urology ?Why: or when scheduled by clinic for definite stone managment ?Contact information: ?[address] ?[place] [address] ?[phone] ? ? ?  ?  ? ?  ?  ? ?  ? ?Subjective. ?Today, patient was in and examined at bedside.  Continues to feel better.  Was able to ambulate okay.  Pain is much better at this time.  Denies any fever chills or rigor. ? ?Discharge Exam: ?Filed Weights  ? 01/09/22 0500 01/10/22 0500 01/11/22 0500  ?Weight: (!) 151 kg (!) 150.1 kg (!) 150.5 kg  ? ? ?  01/11/2022  ?  9:57 AM 01/11/2022  ?  5:58 AM 01/11/2022  ?  5:00 AM  ?Vitals with BMI  ?Weight   331 lbs 13 oz  ?BMI   51.95  ?Systolic 008 676   ?Diastolic 84 89   ?Pulse 68 68   ?  ?General: Morbidly obese, not in obvious distress ?HENT:   No scleral pallor or icterus noted. Oral mucosa is moist.  ?Chest:  Clear breath sounds.  Diminished breath sounds bilaterally. No crackles or wheezes.  ?CVS: S1 &S2 heard. No murmur.  Regular rate and rhythm. ?Abdomen: Soft, nontender, nondistended.  Bowel sounds are heard.   ?Extremities: No cyanosis, clubbing or edema.  Peripheral pulses are palpable. ?Psych: Alert, awake and oriented, normal mood ?CNS:  No cranial nerve deficits.  Power equal in all extremities.   ?Skin: Warm and dry.  No rashes noted. ? ? ?Condition at discharge: good ? ?The results of significant diagnostics from this hospitalization (including imaging, microbiology, ancillary and laboratory) are listed below for reference.  ? ?Imaging Studies: ?DG Abd 1 View ? ?Result Date: 01/08/2022 ?CLINICAL DATA:   Nephrolithiasis. EXAM: ABDOMEN - 1 VIEW COMPARISON:  None Available. FINDINGS: Technically limited by large patient habitus. Double pigtail right ureteral stent is seen in place. No large radiopaque calculi identified, although sensitivity limited by large patient habitus. Right upper quadrant surgical clips are seen from prior cholecystectomy. The bowel gas pattern is normal. IMPRESSION: Technically limited by large habitus. Right ureteral stent in place. No acute findings. Electronically Signed   By: Marlaine Hind M.D.   On: 01/08/2022 10:14  ? ?DG Chest Port 1 View ? ?Result Date: 01/07/2022 ?CLINICAL DATA:  Sepsis EXAM: PORTABLE CHEST 1 VIEW COMPARISON:  None Available. FINDINGS: The heart size and mediastinal contours are within normal limits. Both lungs are clear. The visualized  skeletal structures are unremarkable. IMPRESSION: No active disease. Electronically Signed   By: Ulyses Jarred M.D.   On: 01/07/2022 21:56  ? ?DG C-Arm 1-60 Min-No Report ? ?Result Date: 01/08/2022 ?Fluoroscopy was utilized by the requesting physician.  No radiographic interpretation.   ? ?Microbiology: ?Results for orders placed or performed during the hospital encounter of 01/07/22  ?Urine Culture     Status: Abnormal  ? Collection Time: 01/07/22  9:11 PM  ? Specimen: Urine, Clean Catch  ?Result Value Ref Range Status  ? Specimen Description   Final  ?  IN/OUT CATH URINE ?Performed at [facility], [address] [place], [place] [address] ?  ? Special Requests   Final  ?  NONE ?Performed at [facility], [address], [place], [place] [address] ?  ? Culture >=100,000 COLONIES/mL ESCHERICHIA COLI (A)  Final  ? Report Status 01/10/2022 FINAL  Final  ? Organism ID, Bacteria ESCHERICHIA COLI (A)  Final  ?    Susceptibility  ? Escherichia coli - MIC*  ?  AMPICILLIN <=2 SENSITIVE Sensitive   ?  CEFAZOLIN <=4 SENSITIVE Sensitive   ?  CEFEPIME <=0.12 SENSITIVE Sensitive   ?  CEFTRIAXONE <=0.25 SENSITIVE Sensitive   ?  CIPROFLOXACIN >=4  RESISTANT Resistant   ?  GENTAMICIN <=1 SENSITIVE Sensitive   ?  IMIPENEM <=0.25 SENSITIVE Sensitive   ?  NITROFURANTOIN 32 SENSITIVE Sensitive   ?  TRIMETH/SULFA <=20 SENSITIVE Sensitive   ?  AMPICILLIN/SULBACTAM <

## 2022-01-11 NOTE — Progress Notes (Signed)
Discharge package printed and instructions given to patient. Verbalizes understanding.  

## 2022-01-11 NOTE — Plan of Care (Signed)
  Problem: Activity: Goal: Risk for activity intolerance will decrease Outcome: Progressing   Problem: Pain Managment: Goal: General experience of comfort will improve Outcome: Progressing   Problem: Safety: Goal: Ability to remain free from injury will improve Outcome: Progressing   

## 2022-01-13 LAB — CULTURE, BLOOD (ROUTINE X 2): Special Requests: ADEQUATE

## 2022-01-15 DIAGNOSIS — A498 Other bacterial infections of unspecified site: Secondary | ICD-10-CM | POA: Insufficient documentation

## 2022-01-15 DIAGNOSIS — N289 Disorder of kidney and ureter, unspecified: Secondary | ICD-10-CM | POA: Insufficient documentation

## 2022-01-16 DIAGNOSIS — R6521 Severe sepsis with septic shock: Secondary | ICD-10-CM | POA: Diagnosis not present

## 2022-01-16 DIAGNOSIS — N390 Urinary tract infection, site not specified: Secondary | ICD-10-CM | POA: Diagnosis not present

## 2022-01-17 ENCOUNTER — Encounter: Payer: Self-pay | Admitting: Urology

## 2022-01-17 ENCOUNTER — Ambulatory Visit (INDEPENDENT_AMBULATORY_CARE_PROVIDER_SITE_OTHER): Payer: 59 | Admitting: Urology

## 2022-01-17 VITALS — BP 159/81 | HR 85

## 2022-01-17 DIAGNOSIS — Z87440 Personal history of urinary (tract) infections: Secondary | ICD-10-CM | POA: Diagnosis not present

## 2022-01-17 DIAGNOSIS — N200 Calculus of kidney: Secondary | ICD-10-CM

## 2022-01-17 DIAGNOSIS — R310 Gross hematuria: Secondary | ICD-10-CM | POA: Diagnosis not present

## 2022-01-17 MED ORDER — NITROFURANTOIN MONOHYD MACRO 100 MG PO CAPS: 100.0000 mg | ORAL_CAPSULE | Freq: Every day | ORAL | 2 refills | Status: DC

## 2022-01-17 MED ORDER — MIRABEGRON ER 25 MG PO TB24: 25.0000 mg | ORAL_TABLET | Freq: Every day | ORAL | 0 refills | Status: DC

## 2022-01-17 NOTE — Progress Notes (Signed)
? ?Assessment: ?1. Nephrolithiasis   ?2. Gross hematuria   ?3. History of UTI   ? ? ?Plan: ?Hospital records reviewed. ?Request recent lab results from Dr. Nevada Crane ?Complete oral antibiotics. ?Given daily Macrobid for UTI prevention.  Prescription sent. ?Continue right ureteral stent. ?Samples of Myrbetriq 25 mg daily provided. ?Options for management of the right renal calculus discussed including shockwave lithotripsy or ureteroscopic laser lithotripsy.  Risk and benefits of each procedure discussed.  Following our discussion, she would like to proceed with ureteroscopic laser lithotripsy. ? ?Procedure: ?The patient will be scheduled for cystoscopy, right retrograde pyelogram, right ureteroscopic laser lithotripsy, insertion of right ureteral stent at [facility].  Surgical request is placed with the surgery schedulers and will be scheduled at the patient's/family request. Informed consent is given as documented below. ?Anesthesia: General ? ?The patient does not have sleep apnea, history of MRSA, history of VRE, history of cardiac device requiring special anesthetic needs. ?Patient is stable and considered clear for surgical in an outpatient ambulatory surgery setting as well as patient hospital setting. ? ?Consent for Operation or Procedure: Provider Certification ?I hereby certify that the nature, purpose, benefits, usual and most frequent risks of, and alternatives to, the operation or procedure have been explained to the patient (or person authorized to sign for the patient) either by me as responsible physician or by the provider who is to perform the operation or procedure. Time spent such that the patient/family has had an opportunity to ask questions, and that those questions have been answered. The patient or the patient's representative has been advised that selected tasks may be performed by assistants to the primary health care provider(s). I believe that the patient (or person authorized to sign for  the patient) understands what has been explained, and has consented to the operation or procedure. No guarantees were implied or made. ? ? ?Chief Complaint:  ?Chief Complaint  ?Patient presents with  ? Nephrolithiasis  ? ? ?History of Present Illness:  ? ?Alexa [name] is a 61 y.o. year old female who is seen for further evaluation of gross hematuria.  She has a history of intermittent episodes of gross hematuria for the past 18 months.  These episodes typically last approximately 1 week and resolve spontaneously. She does not have any dysuria or flank pain associated with these episodes. ?Dipstick urinalysis from 09/09/2021 demonstrated large blood, nitrite positive.  No urine culture results available.  She was treated with Cipro which she took for 2 days then discontinued.  She reports being treated for 5 UTIs over the past 18 months.  These were based on urinalysis results.  No prior history of UTIs. ?Creatinine 0.89 from 09/09/2021. ?She has nocturia times and occasional urgency. ? ?She has a history of nephrolithiasis.  CT imaging from 8/16 demonstrated a 3 mm right UPJ calculus with associated right hydronephrosis and a tiny calculus in the right renal pelvis.  This was her last stone episode.  No recent imaging has been performed. ? ?She has a prior history of tobacco use smoking less than 1 pack/day for approximately 20 years.  She quit 30 years ago.  No other toxic exposures.  She is a school bus driver. ? ?Urine culture from 09/26/2021 grew >100 K E. coli.  She was treated with Macrobid. ?CT hematuria protocol from 10/26/2021 showed a 13 x 8 mm calculus in the right renal pelvis and an additional 2-3 mm calculus in the interpolar right kidney without obstruction. ?She was scheduled for cystoscopy  on 11/11/2021.  She noted onset of gross hematuria several days prior to her visit.  She also had some intermittent right-sided flank pain.   ?Cystoscopy was postponed due to apparent UTI.  Urine culture grew >100 K  E. coli.  She was treated with Bactrim and started on daily Macrobid. ?Cystoscopy from 11/25/2021 demonstrated no urethral or bladder abnormalities.  Treatment of the right renal calculus was discussed with the patient.  She was scheduled for right ESL but elected to postpone treatment until sometime in late May or early June. ?She developed right-sided flank pain and was scheduled for right ESL on 01/10/2022. ?She presented to the emergency room on 01/04/2022 with right-sided flank pain.  Urinalysis demonstrated many bacteria.  No urine culture was performed.  No imaging was performed. ?She returned to the emergency room on 01/07/2022 with fever and chills.  She was found to be septic and was transferred to [facility]. ?Urine culture grew >100 K E. coli. ?She underwent right ureteral stent placement by Dr. Louis Meckel on 01/08/2022.  She was discharged from the hospital on 01/11/2022. ? ?She presents today for follow-up.  She was discharged on Duricef x7 days and will complete this tomorrow.  She is feeling slightly better.  She is having some stent related symptoms with frequency, urgency, and urge incontinence.  No dysuria or gross hematuria.  She reports some nausea which she associates with the antibiotics.  No vomiting.  No fevers or chills. ? ?Portions of the above documentation were copied from a prior visit for review purposes only. ? ? ?Past Medical History:  ?Past Medical History:  ?Diagnosis Date  ? Arthritis   ? back, legs, knees  ? 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 History of kidney stones   ? Hypothyroidism   ? Thyroid disease   ? ? ?Past Surgical History:  ?Past Surgical History:  ?Procedure Laterality Date  ? ABDOMINAL HYSTERECTOMY    ? CARPAL TUNNEL RELEASE Right   ? CHOLECYSTECTOMY    ? COLONOSCOPY N/A 09/23/2018  ? Procedure: COLONOSCOPY;  Surgeon: Rogene Houston, MD;  Location: [facility] ENDO SUITE;  Service: Endoscopy;  Laterality: N/A;  10:30  ? CYSTOSCOPY WITH RETROGRADE PYELOGRAM, URETEROSCOPY AND STENT  PLACEMENT Right 01/08/2022  ? Procedure: [place], URETEROSCOPY AND STENT PLACEMENT;  Surgeon: Ardis Hughs, MD;  Location: [facility] ORS;  Service: Urology;  Laterality: Right;  ? POLYPECTOMY  09/23/2018  ? Procedure: POLYPECTOMY;  Surgeon: Rogene Houston, MD;  Location: [facility] ENDO SUITE;  Service: Endoscopy;;  CS and BX  ? TONSILLECTOMY    ? ? ?Allergies:  ?Allergies  ?Allergen Reactions  ? Codeine Nausea Only  ? ? ?Family History:  ?No family history on file. ? ?Social History:  ?Social History  ? ?Tobacco Use  ? Smoking status: Former  ? Smokeless tobacco: Never  ?Vaping Use  ? Vaping Use: Never used  ?Substance Use Topics  ? Alcohol use: No  ? Drug use: No  ? ? ?ROS: ?Constitutional:  Negative for fever, chills, weight loss ?CV: Negative for chest pain, previous MI, hypertension ?Respiratory:  Negative for shortness of breath, wheezing, sleep apnea, frequent cough ?GI:  Negative for nausea, vomiting, bloody stool, GERD ? ?Physical exam: ?BP (!) 159/81   Pulse 85  ?GENERAL APPEARANCE:  Well appearing, well developed, well nourished, NAD ?HEENT: Atraumatic, Normocephalic, oropharynx clear. ?NECK: Supple without lymphadenopathy or thyromegaly. ?LUNGS: Clear to auscultation bilaterally. ?HEART: Regular Rate and Rhythm without murmurs, gallops, or rubs. ?  ABDOMEN: Soft, non-tender, No Masses. ?EXTREMITIES: Moves all extremities well.  Without clubbing, cyanosis, or edema. ?NEUROLOGIC:  Alert and oriented x 3, normal gait, CN II-XII grossly intact.  ?MENTAL STATUS:  Appropriate. ?BACK:  Non-tender to palpation.  No CVAT ?SKIN:  Warm, dry and intact.   ? ?Results: ?U/A: 6-10 WBCs, 3-10 RBCs, moderate bacteria, nitrite negative ? ? ?

## 2022-01-17 NOTE — H&P (View-Only) (Signed)
Assessment: 1. Nephrolithiasis   2. Gross hematuria   3. History of UTI     Plan: Hospital records reviewed. Request recent lab results from Dr. Nevada Crane Complete oral antibiotics. Given daily Macrobid for UTI prevention.  Prescription sent. Continue right ureteral stent. Samples of Myrbetriq 25 mg daily provided. Options for management of the right renal calculus discussed including shockwave lithotripsy or ureteroscopic laser lithotripsy.  Risk and benefits of each procedure discussed.  Following our discussion, she would like to proceed with ureteroscopic laser lithotripsy.  Procedure: The patient will be scheduled for cystoscopy, right retrograde pyelogram, right ureteroscopic laser lithotripsy, insertion of right ureteral stent at [facility].  Surgical request is placed with the surgery schedulers and will be scheduled at the patient's/family request. Informed consent is given as documented below. Anesthesia: General  The patient does not have sleep apnea, history of MRSA, history of VRE, history of cardiac device requiring special anesthetic needs. Patient is stable and considered clear for surgical in an outpatient ambulatory surgery setting as well as patient hospital setting.  Consent for Operation or Procedure: Provider Certification I hereby certify that the nature, purpose, benefits, usual and most frequent risks of, and alternatives to, the operation or procedure have been explained to the patient (or person authorized to sign for the patient) either by me as responsible physician or by the provider who is to perform the operation or procedure. Time spent such that the patient/family has had an opportunity to ask questions, and that those questions have been answered. The patient or the patient's representative has been advised that selected tasks may be performed by assistants to the primary health care provider(s). I believe that the patient (or person authorized to sign for  the patient) understands what has been explained, and has consented to the operation or procedure. No guarantees were implied or made.   Chief Complaint:  Chief Complaint  Patient presents with   Nephrolithiasis    History of Present Illness:   Alexa [name] is a 61 y.o. year old female who is seen for further evaluation of gross hematuria.  She has a history of intermittent episodes of gross hematuria for the past 18 months.  These episodes typically last approximately 1 week and resolve spontaneously. She does not have any dysuria or flank pain associated with these episodes. Dipstick urinalysis from 09/09/2021 demonstrated large blood, nitrite positive.  No urine culture results available.  She was treated with Cipro which she took for 2 days then discontinued.  She reports being treated for 5 UTIs over the past 18 months.  These were based on urinalysis results.  No prior history of UTIs. Creatinine 0.89 from 09/09/2021. She has nocturia times and occasional urgency.  She has a history of nephrolithiasis.  CT imaging from 8/16 demonstrated a 3 mm right UPJ calculus with associated right hydronephrosis and a tiny calculus in the right renal pelvis.  This was her last stone episode.  No recent imaging has been performed.  She has a prior history of tobacco use smoking less than 1 pack/day for approximately 20 years.  She quit 30 years ago.  No other toxic exposures.  She is a school bus driver.  Urine culture from 09/26/2021 grew >100 K E. coli.  She was treated with Macrobid. CT hematuria protocol from 10/26/2021 showed a 13 x 8 mm calculus in the right renal pelvis and an additional 2-3 mm calculus in the interpolar right kidney without obstruction. She was scheduled for cystoscopy  on 11/11/2021.  She noted onset of gross hematuria several days prior to her visit.  She also had some intermittent right-sided flank pain.   Cystoscopy was postponed due to apparent UTI.  Urine culture grew >100 K  E. coli.  She was treated with Bactrim and started on daily Macrobid. Cystoscopy from 11/25/2021 demonstrated no urethral or bladder abnormalities.  Treatment of the right renal calculus was discussed with the patient.  She was scheduled for right ESL but elected to postpone treatment until sometime in late May or early June. She developed right-sided flank pain and was scheduled for right ESL on 01/10/2022. She presented to the emergency room on 01/04/2022 with right-sided flank pain.  Urinalysis demonstrated many bacteria.  No urine culture was performed.  No imaging was performed. She returned to the emergency room on 01/07/2022 with fever and chills.  She was found to be septic and was transferred to [facility]. Urine culture grew >100 K E. coli. She underwent right ureteral stent placement by Dr. Louis Meckel on 01/08/2022.  She was discharged from the hospital on 01/11/2022.  She presents today for follow-up.  She was discharged on Duricef x7 days and will complete this tomorrow.  She is feeling slightly better.  She is having some stent related symptoms with frequency, urgency, and urge incontinence.  No dysuria or gross hematuria.  She reports some nausea which she associates with the antibiotics.  No vomiting.  No fevers or chills.  Portions of the above documentation were copied from a prior visit for review purposes only.   Past Medical History:  Past Medical History:  Diagnosis Date   Arthritis    back, legs, knees   Cancer ([place]) 1984   uterine   History of kidney stones    Hypothyroidism    Thyroid disease     Past Surgical History:  Past Surgical History:  Procedure Laterality Date   ABDOMINAL HYSTERECTOMY     CARPAL TUNNEL RELEASE Right    CHOLECYSTECTOMY     COLONOSCOPY N/A 09/23/2018   Procedure: COLONOSCOPY;  Surgeon: Rogene Houston, MD;  Location: [facility];  Service: Endoscopy;  Laterality: N/A;  10:30   CYSTOSCOPY WITH RETROGRADE PYELOGRAM, URETEROSCOPY AND STENT  PLACEMENT Right 01/08/2022   Procedure: CYSTOSCOPY WITH RETROGRADE PYELOGRAM, URETEROSCOPY AND STENT PLACEMENT;  Surgeon: Ardis Hughs, MD;  Location: [facility];  Service: Urology;  Laterality: Right;   POLYPECTOMY  09/23/2018   Procedure: POLYPECTOMY;  Surgeon: Rogene Houston, MD;  Location: [facility];  Service: Endoscopy;;  CS and BX   TONSILLECTOMY      Allergies:  Allergies  Allergen Reactions   Codeine Nausea Only    Family History:  No family history on file.  Social History:  Social History   Tobacco Use   Smoking status: Former   Smokeless tobacco: Never  Scientific laboratory technician Use: Never used  Substance Use Topics   Alcohol use: No   Drug use: No    ROS: Constitutional:  Negative for fever, chills, weight loss CV: Negative for chest pain, previous MI, hypertension Respiratory:  Negative for shortness of breath, wheezing, sleep apnea, frequent cough GI:  Negative for nausea, vomiting, bloody stool, GERD  Physical exam: BP (!) 159/81   Pulse 85  GENERAL APPEARANCE:  Well appearing, well developed, well nourished, NAD HEENT: Atraumatic, Normocephalic, oropharynx clear. NECK: Supple without lymphadenopathy or thyromegaly. LUNGS: Clear to auscultation bilaterally. HEART: Regular Rate and Rhythm without murmurs, gallops, or rubs.  ABDOMEN: Soft, non-tender, No Masses. EXTREMITIES: Moves all extremities well.  Without clubbing, cyanosis, or edema. NEUROLOGIC:  Alert and oriented x 3, normal gait, CN II-XII grossly intact.  MENTAL STATUS:  Appropriate. BACK:  Non-tender to palpation.  No CVAT SKIN:  Warm, dry and intact.    Results: U/A: 6-10 WBCs, 3-10 RBCs, moderate bacteria, nitrite negative

## 2022-01-18 LAB — URINALYSIS, ROUTINE W REFLEX MICROSCOPIC
Bilirubin, UA: NEGATIVE
Glucose, UA: NEGATIVE
Ketones, UA: NEGATIVE
Nitrite, UA: NEGATIVE
Specific Gravity, UA: 1.015 (ref 1.005–1.030)
Urobilinogen, Ur: 0.2 mg/dL (ref 0.2–1.0)
pH, UA: 7 (ref 5.0–7.5)

## 2022-01-18 LAB — MICROSCOPIC EXAMINATION: Renal Epithel, UA: NONE SEEN /hpf

## 2022-01-24 ENCOUNTER — Other Ambulatory Visit: Payer: Self-pay

## 2022-01-24 ENCOUNTER — Encounter: Payer: Self-pay | Admitting: Urology

## 2022-01-26 ENCOUNTER — Encounter (HOSPITAL_COMMUNITY)
Admission: RE | Admit: 2022-01-26 | Discharge: 2022-01-26 | Disposition: A | Discharge status: Home / Self Care | Payer: 59 | Source: Ambulatory Visit | Attending: Urology | Admitting: Urology

## 2022-02-01 ENCOUNTER — Ambulatory Visit (HOSPITAL_COMMUNITY)
Admission: RE | Admit: 2022-02-01 | Discharge: 2022-02-01 | Disposition: A | Discharge status: Home / Self Care | Payer: 59 | Source: Ambulatory Visit | Attending: Urology | Admitting: Urology

## 2022-02-01 ENCOUNTER — Ambulatory Visit (HOSPITAL_COMMUNITY): Payer: 59

## 2022-02-01 ENCOUNTER — Ambulatory Visit (HOSPITAL_BASED_OUTPATIENT_CLINIC_OR_DEPARTMENT_OTHER): Payer: 59 | Admitting: Anesthesiology

## 2022-02-01 ENCOUNTER — Ambulatory Visit (HOSPITAL_COMMUNITY): Payer: 59 | Admitting: Anesthesiology

## 2022-02-01 ENCOUNTER — Encounter (HOSPITAL_COMMUNITY): Payer: Self-pay | Admitting: Urology

## 2022-02-01 ENCOUNTER — Encounter (HOSPITAL_COMMUNITY)
Admission: RE | Disposition: A | Discharge status: Home / Self Care | Payer: Self-pay | Source: Ambulatory Visit | Attending: Urology

## 2022-02-01 ENCOUNTER — Other Ambulatory Visit: Payer: Self-pay | Admitting: Urology

## 2022-02-01 DIAGNOSIS — E039 Hypothyroidism, unspecified: Secondary | ICD-10-CM

## 2022-02-01 DIAGNOSIS — Z6841 Body Mass Index (BMI) 40.0 and over, adult: Secondary | ICD-10-CM

## 2022-02-01 DIAGNOSIS — N200 Calculus of kidney: Secondary | ICD-10-CM | POA: Insufficient documentation

## 2022-02-01 DIAGNOSIS — Z87891 Personal history of nicotine dependence: Secondary | ICD-10-CM | POA: Diagnosis not present

## 2022-02-01 HISTORY — PX: STONE EXTRACTION WITH BASKET: SHX5318

## 2022-02-01 HISTORY — PX: HOLMIUM LASER APPLICATION: SHX5852

## 2022-02-01 HISTORY — PX: CYSTOSCOPY WITH RETROGRADE PYELOGRAM, URETEROSCOPY AND STENT PLACEMENT: SHX5789

## 2022-02-01 SURGERY — CYSTOURETEROSCOPY, WITH RETROGRADE PYELOGRAM AND STENT INSERTION
Anesthesia: General | Site: Ureter | Laterality: Right

## 2022-02-01 MED ORDER — ONDANSETRON HCL 4 MG/2ML IJ SOLN
INTRAMUSCULAR | Status: AC
  Filled 2022-02-01: qty 2

## 2022-02-01 MED ORDER — SUGAMMADEX SODIUM 500 MG/5ML IV SOLN
INTRAVENOUS | Status: AC
  Filled 2022-02-01: qty 5

## 2022-02-01 MED ORDER — SODIUM CHLORIDE 0.9 % IR SOLN
Status: DC | PRN
  Administered 2022-02-01 (×2): 3000 mL

## 2022-02-01 MED ORDER — FENTANYL CITRATE (PF) 100 MCG/2ML IJ SOLN
INTRAMUSCULAR | Status: DC | PRN
  Administered 2022-02-01 (×2): 50 ug via INTRAVENOUS

## 2022-02-01 MED ORDER — ROCURONIUM BROMIDE 10 MG/ML (PF) SYRINGE
PREFILLED_SYRINGE | INTRAVENOUS | Status: DC | PRN
  Administered 2022-02-01: 70 mg via INTRAVENOUS

## 2022-02-01 MED ORDER — CHLORHEXIDINE GLUCONATE 0.12 % MT SOLN: 15.0000 mL | Freq: Once | OROMUCOSAL | Status: DC

## 2022-02-01 MED ORDER — PHENAZOPYRIDINE HCL 200 MG PO TABS: 200.0000 mg | ORAL_TABLET | Freq: Three times a day (TID) | ORAL | 1 refills | Status: DC | PRN

## 2022-02-01 MED ORDER — OXYBUTYNIN CHLORIDE 5 MG PO TABS: 5.0000 mg | ORAL_TABLET | Freq: Three times a day (TID) | ORAL | 1 refills | Status: DC | PRN

## 2022-02-01 MED ORDER — NYSTATIN 100000 UNIT/GM EX CREA: TOPICAL_CREAM | CUTANEOUS | 0 refills | Status: AC

## 2022-02-01 MED ORDER — ONDANSETRON HCL 4 MG/2ML IJ SOLN
INTRAMUSCULAR | Status: DC | PRN
  Administered 2022-02-01: 4 mg via INTRAVENOUS

## 2022-02-01 MED ORDER — SODIUM CHLORIDE 0.9 % IV SOLN
2.0000 g | INTRAVENOUS | Status: AC
  Administered 2022-02-01: 2 g via INTRAVENOUS

## 2022-02-01 MED ORDER — PHENYLEPHRINE 80 MCG/ML (10ML) SYRINGE FOR IV PUSH (FOR BLOOD PRESSURE SUPPORT)
PREFILLED_SYRINGE | INTRAVENOUS | Status: AC
  Filled 2022-02-01: qty 10

## 2022-02-01 MED ORDER — SODIUM CHLORIDE 0.9 % IV SOLN
INTRAVENOUS | Status: DC
  Administered 2022-02-01: 1000 mL via INTRAVENOUS

## 2022-02-01 MED ORDER — MIDAZOLAM HCL 5 MG/5ML IJ SOLN
INTRAMUSCULAR | Status: DC | PRN
  Administered 2022-02-01: 2 mg via INTRAVENOUS

## 2022-02-01 MED ORDER — WATER FOR IRRIGATION, STERILE IR SOLN
Status: DC | PRN
  Administered 2022-02-01: 1000 mL

## 2022-02-01 MED ORDER — ROCURONIUM BROMIDE 10 MG/ML (PF) SYRINGE
PREFILLED_SYRINGE | INTRAVENOUS | Status: AC
  Filled 2022-02-01: qty 10

## 2022-02-01 MED ORDER — DEXAMETHASONE SODIUM PHOSPHATE 10 MG/ML IJ SOLN
INTRAMUSCULAR | Status: AC
  Filled 2022-02-01: qty 1

## 2022-02-01 MED ORDER — SUGAMMADEX SODIUM 500 MG/5ML IV SOLN
INTRAVENOUS | Status: DC | PRN
  Administered 2022-02-01: 300 mg via INTRAVENOUS

## 2022-02-01 MED ORDER — ONDANSETRON HCL 4 MG/2ML IJ SOLN: 4.0000 mg | Freq: Once | INTRAMUSCULAR | Status: DC | PRN

## 2022-02-01 MED ORDER — DEXAMETHASONE SODIUM PHOSPHATE 4 MG/ML IJ SOLN
INTRAMUSCULAR | Status: DC | PRN
  Administered 2022-02-01: 4 mg via INTRAVENOUS

## 2022-02-01 MED ORDER — LIDOCAINE HCL URETHRAL/MUCOSAL 2 % EX GEL
CUTANEOUS | Status: AC
  Filled 2022-02-01: qty 10

## 2022-02-01 MED ORDER — CHLORHEXIDINE GLUCONATE 0.12 % MT SOLN
OROMUCOSAL | Status: AC
  Administered 2022-02-01: 15 mL
  Filled 2022-02-01: qty 15

## 2022-02-01 MED ORDER — DIATRIZOATE MEGLUMINE 30 % UR SOLN
URETHRAL | Status: AC
  Filled 2022-02-01: qty 100

## 2022-02-01 MED ORDER — ORAL CARE MOUTH RINSE: 15.0000 mL | Freq: Once | OROMUCOSAL | Status: DC

## 2022-02-01 MED ORDER — LIDOCAINE HCL (PF) 2 % IJ SOLN
INTRAMUSCULAR | Status: AC
  Filled 2022-02-01: qty 5

## 2022-02-01 MED ORDER — LIDOCAINE HCL URETHRAL/MUCOSAL 2 % EX GEL
CUTANEOUS | Status: DC | PRN
  Administered 2022-02-01: 1 via URETHRAL

## 2022-02-01 MED ORDER — MIDAZOLAM HCL 2 MG/2ML IJ SOLN
INTRAMUSCULAR | Status: AC
  Filled 2022-02-01: qty 2

## 2022-02-01 MED ORDER — SODIUM CHLORIDE 0.9 % IV SOLN
INTRAVENOUS | Status: AC
  Filled 2022-02-01: qty 20

## 2022-02-01 MED ORDER — PHENYLEPHRINE 80 MCG/ML (10ML) SYRINGE FOR IV PUSH (FOR BLOOD PRESSURE SUPPORT)
PREFILLED_SYRINGE | INTRAVENOUS | Status: DC | PRN
  Administered 2022-02-01 (×2): 80 ug via INTRAVENOUS

## 2022-02-01 MED ORDER — PROPOFOL 10 MG/ML IV BOLUS
INTRAVENOUS | Status: DC | PRN
  Administered 2022-02-01: 200 mg via INTRAVENOUS

## 2022-02-01 MED ORDER — PROPOFOL 10 MG/ML IV BOLUS
INTRAVENOUS | Status: AC
  Filled 2022-02-01: qty 20

## 2022-02-01 MED ORDER — FENTANYL CITRATE (PF) 100 MCG/2ML IJ SOLN
INTRAMUSCULAR | Status: AC
  Filled 2022-02-01: qty 2

## 2022-02-01 MED ORDER — DIATRIZOATE MEGLUMINE 30 % UR SOLN
URETHRAL | Status: DC | PRN
  Administered 2022-02-01: 7 mL via URETHRAL

## 2022-02-01 MED ORDER — FENTANYL CITRATE PF 50 MCG/ML IJ SOSY: 25.0000 ug | PREFILLED_SYRINGE | INTRAMUSCULAR | Status: DC | PRN

## 2022-02-01 MED ORDER — LIDOCAINE HCL (CARDIAC) PF 100 MG/5ML IV SOSY
PREFILLED_SYRINGE | INTRAVENOUS | Status: DC | PRN
  Administered 2022-02-01: 60 mg via INTRAVENOUS

## 2022-02-01 SURGICAL SUPPLY — 26 items
BAG DRAIN URO TABLE W/ADPT NS (BAG) ×2 IMPLANT
BAG DRN 8 ADPR NS SKTRN CSTL (BAG) ×1
BAG HAMPER (MISCELLANEOUS) ×2 IMPLANT
CATH URET 5FR 28IN OPEN ENDED (CATHETERS) ×2 IMPLANT
CLOTH BEACON ORANGE TIMEOUT ST (SAFETY) ×2 IMPLANT
COVER MAYO STAND XLG (MISCELLANEOUS) ×2 IMPLANT
EXTRACTOR STONE NITINOL NGAGE (UROLOGICAL SUPPLIES) ×1 IMPLANT
GLOVE BIO SURGEON STRL SZ8 (GLOVE) ×2 IMPLANT
GLOVE BIOGEL PI IND STRL 7.0 (GLOVE) ×2 IMPLANT
GLOVE BIOGEL PI INDICATOR 7.0 (GLOVE) ×3
GOWN STRL REUS W/TWL LRG LVL3 (GOWN DISPOSABLE) ×2 IMPLANT
GOWN STRL REUS W/TWL XL LVL3 (GOWN DISPOSABLE) ×2 IMPLANT
GUIDEWIRE STR DUAL SENSOR (WIRE) ×2 IMPLANT
IV NS IRRIG 3000ML ARTHROMATIC (IV SOLUTION) ×4 IMPLANT
KIT TURNOVER CYSTO (KITS) ×2 IMPLANT
MANIFOLD NEPTUNE II (INSTRUMENTS) ×2 IMPLANT
PACK CYSTO (CUSTOM PROCEDURE TRAY) ×2 IMPLANT
PAD ARMBOARD 7.5X6 YLW CONV (MISCELLANEOUS) ×2 IMPLANT
SHEATH URETERAL FLEX 12X35 (SHEATH) ×1 IMPLANT
STENT URET 6FRX26 CONTOUR (STENTS) ×1 IMPLANT
SYR 10ML LL (SYRINGE) ×2 IMPLANT
SYR CONTROL 10ML LL (SYRINGE) ×1 IMPLANT
TOWEL OR 17X26 4PK STRL BLUE (TOWEL DISPOSABLE) ×2 IMPLANT
TRACTIP FLEXIVA PULS ID 200XHI (Laser) IMPLANT
TRACTIP FLEXIVA PULSE ID 200 (Laser) ×2
WATER STERILE IRR 500ML POUR (IV SOLUTION) ×2 IMPLANT

## 2022-02-01 NOTE — Anesthesia Procedure Notes (Signed)
Procedure Name: Intubation Date/Time: 02/01/2022 1:03 PM Performed by: Lieutenant Diego, CRNA Pre-anesthesia Checklist: Patient identified, Emergency Drugs available, Suction available and Patient being monitored Patient Re-evaluated:Patient Re-evaluated prior to induction Oxygen Delivery Method: Circle system utilized Preoxygenation: Pre-oxygenation with 100% oxygen Induction Type: IV induction Ventilation: Mask ventilation without difficulty Laryngoscope Size: Miller and 2 Grade View: Grade I Tube type: Oral Tube size: 7.0 mm Number of attempts: 1 Airway Equipment and Method: Stylet Placement Confirmation: ETT inserted through vocal cords under direct vision, positive ETCO2 and breath sounds checked- equal and bilateral Secured at: 21 cm Tube secured with: Tape Dental Injury: Teeth and Oropharynx as per pre-operative assessment

## 2022-02-01 NOTE — Anesthesia Preprocedure Evaluation (Signed)
Anesthesia Evaluation  Patient identified by MRN, date of birth, ID band Patient awake    Reviewed: Allergy & Precautions, H&P , NPO status , Patient's Chart, lab work & pertinent test results, reviewed documented beta blocker date and time   Airway Mallampati: II  TM Distance: >3 FB Neck ROM: full    Dental no notable dental hx.    Pulmonary neg pulmonary ROS, former smoker,    Pulmonary exam normal breath sounds clear to auscultation       Cardiovascular Exercise Tolerance: Good negative cardio ROS   Rhythm:regular Rate:Normal     Neuro/Psych negative neurological ROS  negative psych ROS   GI/Hepatic negative GI ROS, Neg liver ROS,   Endo/Other  Hypothyroidism Morbid obesity  Renal/GU negative Renal ROS  negative genitourinary   Musculoskeletal   Abdominal   Peds  Hematology negative hematology ROS (+)   Anesthesia Other Findings   Reproductive/Obstetrics negative OB ROS                             Anesthesia Physical Anesthesia Plan  ASA: 3  Anesthesia Plan: General and General ETT   Post-op Pain Management:    Induction:   PONV Risk Score and Plan: Ondansetron  Airway Management Planned:   Additional Equipment:   Intra-op Plan:   Post-operative Plan:   Informed Consent: I have reviewed the patients History and Physical, chart, labs and discussed the procedure including the risks, benefits and alternatives for the proposed anesthesia with the patient or authorized representative who has indicated his/her understanding and acceptance.     Dental Advisory Given  Plan Discussed with: CRNA  Anesthesia Plan Comments:         Anesthesia Quick Evaluation

## 2022-02-01 NOTE — Op Note (Signed)
OPERATIVE NOTE   Patient Name: Alexa [name]  MRN: [id number]  Date of Procedure: 02/01/22  Preoperative diagnosis:  Right renal calculus  Postoperative diagnosis:  Right renal calculus  Procedure:  Cystoscopy Right retrograde pyelogram Right ureteroscopic laser lithotripsy Right ureteral stent exchange (6 French 26 cm, with tether)  Attending: Primus Bravo, MD  Anesthesia: General  Estimated blood loss: 5 ml  Fluids: Per anesthesia record  Drains: 13F x 26 cm right ureteral stent with tether  Specimens: stone fragments  Antibiotics: Rocephin 2 g IV  Findings: Normal urethra and bladder; 13 x 8 mm calculus in right renal pelvis  Indications:  61 year old female with history of a right renal calculus found during evaluation for gross hematuria.  CT from 10/26/2021 showed a 13 x 8 mm calculus in the right renal pelvis and an additional 2-3 mm calculus in the interpolar right kidney.  Cystoscopy in March 2023 showed no urethral or bladder abnormalities.  Treatment for the right renal calculus with shockwave lithotripsy was scheduled for 01/10/2022.  She presented to the emergency room on 01/07/2022 with fever and chills and was found to be septic.  She was transferred to [facility].  Urine culture grew >100 K E. coli.  She underwent right ureteral stent placement by Dr. Louis Meckel on 01/08/2022.  She has completed her antibiotic therapy.  She presents now for definitive management of the right renal calculus.  Treatment options were discussed including shockwave lithotripsy and ureteroscopic laser lithotripsy.  She has elected to proceed with cystoscopy, right retrograde pyelogram, right ureteroscopic laser lithotripsy, and exchange of the right ureteral stent.  Risks, benefits, alternatives were discussed with the patient in detail.  Potential risks including, but not limited to, infection; bleeding;  injury to urethra, bladder, or ureter; possible need of other  treatments; possible failure to remove the calculus; ureteral  stricture formation; cardiac, pulmonary, cerebrovascular events; and anesthetic complications were discussed.  The patient understands and wishes to proceed.   Description of Procedure:  The patient received IV Rocephin preoperatively.  After successful induction of a general anesthetic, the patient was placed in the dorsolithotomy position.  The patient's genital area was prepped and draped in sterile fashion.  Under direct visualization, a 21 French rigid cystoscope was passed through the urethra and into the bladder.  A right ureteral stent was noted.  There was some mucosal edema noted near the right ureteral orifice secondary to the stent.  No other bladder abnormalities were appreciated.  A right retrograde pyelogram was performed.  Scout film showed a right ureteral stent in good position.  A nonspecific bowel gas pattern was noted.  A 8 x 13 mm calcification was noted alongside the proximal aspect of the stent in the area of the right renal pelvis.  The right ureteral stent was pulled to the meatus with stent graspers.  A sensor guidewire was passed through the stent and into the right renal pelvis.  A 5 French opening catheter was then passed over the guidewire into the proximal right ureter.  Injection of contrast confirmed position within the proximal right ureter.  The previously noted calcification was confirmed to be within the right renal pelvis.  No other abnormalities were appreciated.  A Cook Flexor access sheath was placed over the guidewire and into the proximal right ureter leaving the guidewire in place.  Flexible ureteroscopy was performed through the access sheath and alongside the guidewire.  The stone was easily visualized in the renal pelvis.  Using  the holmium laser fiber, the stone was fragmented into multiple small pieces using settings for dusting.  Following complete fragmentation of the stone, a stone basket was  used to retrieve several of the larger stone fragments.  All the remaining fragments were felt to be quite small and easily passable.  Inspection of all the renal calyces demonstrated no remaining large stone fragments.  The ureter was inspected with removal of the sheath and ureteroscope.  There was no evidence of any ureteral injury.  A 6 French by 26 cm ureteral stent was then passed over the guidewire into the right renal pelvis under cystoscopic and fluoroscopic guidance.  Position was confirmed with fluoroscopy.  The tether was left attached and brought through the meatus.  The bladder was then drained and the cystoscope was removed.  Intraurethral lidocaine jelly was placed.  The patient was then extubated and taken to the post anesthesia care unit in stable condition.  Complications: None  Condition: Stable, extubated, transferred to PACU  Plan:  Discharge to home Continue stent for 1 week.   Continue daily Macrobid. Return to office on 02/09/2022 for stent removal.

## 2022-02-01 NOTE — Interval H&P Note (Signed)
History and Physical Interval Note:  02/01/2022 11:57 AM  Alexa [name]  has presented today for surgery, with the diagnosis of right renal calculus.  The various methods of treatment have been discussed with the patient and family. After consideration of risks, benefits and other options for treatment, the patient has consented to  Procedure(s): CYSTOSCOPY WITH RETROGRADE PYELOGRAM, URETEROSCOPY AND STENT EXCHANGE (Right) HOLMIUM LASER APPLICATION (Right) as a surgical intervention.  The patient's history has been reviewed, patient examined, no change in status, stable for surgery.  I have reviewed the patient's chart and labs.  Questions were answered to the patient's satisfaction.     Michaelle Birks

## 2022-02-01 NOTE — Anesthesia Postprocedure Evaluation (Signed)
Anesthesia Post Note  Patient: Alexa [name]  Procedure(s) Performed: CYSTOSCOPY WITH RETROGRADE PYELOGRAM, URETEROSCOPY AND STENT EXCHANGE (Right: Ureter) HOLMIUM LASER APPLICATION (Right: Ureter) STONE EXTRACTION WITH BASKET (Right: Ureter)  Patient location during evaluation: Phase II Anesthesia Type: General Level of consciousness: awake Pain management: pain level controlled Vital Signs Assessment: post-procedure vital signs reviewed and stable Respiratory status: spontaneous breathing and respiratory function stable Cardiovascular status: blood pressure returned to baseline and stable Postop Assessment: no headache and no apparent nausea or vomiting Anesthetic complications: no Comments: Late entry   No notable events documented.   Last Vitals:  Vitals:   02/01/22 1445 02/01/22 1504  BP: (!) 159/90 (!) 160/89  Pulse:  67  Resp:  17  Temp:  36.6 C  SpO2:  98%    Last Pain:  Vitals:   02/01/22 1504  TempSrc: Oral  PainSc: 0-No pain                 Louann Sjogren

## 2022-02-01 NOTE — Transfer of Care (Signed)
Immediate Anesthesia Transfer of Care Note  Patient: Alexa [name]  Procedure(s) Performed: CYSTOSCOPY WITH RETROGRADE PYELOGRAM, URETEROSCOPY AND STENT EXCHANGE (Right: Ureter) HOLMIUM LASER APPLICATION (Right: Ureter) STONE EXTRACTION WITH BASKET (Right: Ureter)  Patient Location: PACU  Anesthesia Type:General  Level of Consciousness: awake  Airway & Oxygen Therapy: Patient Spontanous Breathing and Patient connected to nasal cannula oxygen  Post-op Assessment: Report given to RN and Post -op Vital signs reviewed and stable  Post vital signs: Reviewed and stable  Last Vitals:  Vitals Value Taken Time  BP 151/81 02/01/22 1418  Temp    Pulse 78 02/01/22 1419  Resp 16 02/01/22 1419  SpO2 94 % 02/01/22 1419  Vitals shown include unvalidated device data.  Last Pain:  Vitals:   02/01/22 1132  TempSrc: Oral  PainSc: 2       Patients Stated Pain Goal: 8 (22/29/79 8921)  Complications: No notable events documented.

## 2022-02-03 ENCOUNTER — Other Ambulatory Visit (HOSPITAL_COMMUNITY): Payer: 59

## 2022-02-03 ENCOUNTER — Encounter (HOSPITAL_COMMUNITY): Payer: Self-pay | Admitting: Urology

## 2022-02-06 LAB — CALCULI, WITH PHOTOGRAPH (CLINICAL LAB)
Calcium Oxalate Dihydrate: 30 %
Calcium Oxalate Monohydrate: 70 %
Weight Calculi: 36 mg

## 2022-02-07 DIAGNOSIS — R300 Dysuria: Secondary | ICD-10-CM | POA: Insufficient documentation

## 2022-02-09 ENCOUNTER — Ambulatory Visit (INDEPENDENT_AMBULATORY_CARE_PROVIDER_SITE_OTHER): Payer: 59 | Admitting: Urology

## 2022-02-09 ENCOUNTER — Encounter: Payer: Self-pay | Admitting: Urology

## 2022-02-09 VITALS — BP 187/104 | HR 98 | Ht 67.0 in | Wt 300.0 lb

## 2022-02-09 DIAGNOSIS — N200 Calculus of kidney: Secondary | ICD-10-CM | POA: Diagnosis not present

## 2022-02-09 DIAGNOSIS — Z87440 Personal history of urinary (tract) infections: Secondary | ICD-10-CM

## 2022-02-09 DIAGNOSIS — Z87442 Personal history of urinary calculi: Secondary | ICD-10-CM

## 2022-02-09 DIAGNOSIS — R8290 Unspecified abnormal findings in urine: Secondary | ICD-10-CM | POA: Diagnosis not present

## 2022-02-09 LAB — URINALYSIS, ROUTINE W REFLEX MICROSCOPIC
Bilirubin, UA: NEGATIVE
Glucose, UA: NEGATIVE
Ketones, UA: NEGATIVE
Nitrite, UA: POSITIVE — AB
Specific Gravity, UA: 1.01 (ref 1.005–1.030)
Urobilinogen, Ur: 1 mg/dL (ref 0.2–1.0)
pH, UA: 5.5 (ref 5.0–7.5)

## 2022-02-09 LAB — MICROSCOPIC EXAMINATION
RBC, Urine: >30 /hpf — AB (ref 0–2)
Renal Epithel, UA: NONE SEEN /hpf
WBC, UA: >30 /hpf — AB (ref 0–5)

## 2022-02-09 MED ORDER — CEFTRIAXONE SODIUM 1 G IJ SOLR
1.0000 g | Freq: Once | INTRAMUSCULAR | Status: AC
  Administered 2022-02-09: 1 g via INTRAMUSCULAR

## 2022-02-09 NOTE — Addendum Note (Signed)
Addended by: Dorisann Frames on: 02/09/2022 01:55 PM   Modules accepted: Orders

## 2022-02-09 NOTE — Progress Notes (Signed)
Assessment: 1. Nephrolithiasis   2. History of UTI   3. Abnormal urine findings     Plan: Stent removed today Urine culture sent today Rocephin 1 gm IM given today Complete Macrobid Return to office in 3 weeks with KUB prior to visit  Chief Complaint:  Chief Complaint  Patient presents with   Nephrolithiasis    History of Present Illness:   Alexa [name] is a 61 y.o. year old female who is seen for further evaluation of gross hematuria.  She has a history of intermittent episodes of gross hematuria for the past 18 months.  These episodes typically last approximately 1 week and resolve spontaneously. She does not have any dysuria or flank pain associated with these episodes. Dipstick urinalysis from 09/09/2021 demonstrated large blood, nitrite positive.  No urine culture results available.  She was treated with Cipro which she took for 2 days then discontinued.  She reports being treated for 5 UTIs over the past 18 months.  These were based on urinalysis results.  No prior history of UTIs. Creatinine 0.89 from 09/09/2021. She has nocturia times and occasional urgency.  She has a history of nephrolithiasis.  CT imaging from 8/16 demonstrated a 3 mm right UPJ calculus with associated right hydronephrosis and a tiny calculus in the right renal pelvis.  This was her last stone episode.  No recent imaging has been performed.  She has a prior history of tobacco use smoking less than 1 pack/day for approximately 20 years.  She quit 30 years ago.  No other toxic exposures.  She is a school bus driver.  Urine culture from 09/26/2021 grew >100 K E. coli.  She was treated with Macrobid. CT hematuria protocol from 10/26/2021 showed a 13 x 8 mm calculus in the right renal pelvis and an additional 2-3 mm calculus in the interpolar right kidney without obstruction. She was scheduled for cystoscopy on 11/11/2021.  She noted onset of gross hematuria several days prior to her visit.  She also had some  intermittent right-sided flank pain.   Cystoscopy was postponed due to apparent UTI.  Urine culture grew >100 K E. coli.  She was treated with Bactrim and started on daily Macrobid. Cystoscopy from 11/25/2021 demonstrated no urethral or bladder abnormalities.  Treatment of the right renal calculus was discussed with the patient.  She was scheduled for right ESL but elected to postpone treatment until sometime in late May or early June. She developed right-sided flank pain and was scheduled for right ESL on 01/10/2022. She presented to the emergency room on 01/04/2022 with right-sided flank pain.  Urinalysis demonstrated many bacteria.  No urine culture was performed.  No imaging was performed. She returned to the emergency room on 01/07/2022 with fever and chills.  She was found to be septic and was transferred to [facility]. Urine culture grew >100 K E. coli. She underwent right ureteral stent placement by Dr. Louis Meckel on 01/08/2022.  She was discharged from the hospital on 01/11/2022.  She is status post right ureteroscopic laser lithotripsy with stent exchange on 02/01/2022.  The stone was completely fragmented. Stone analysis showed 70% calcium oxalate monohydrate and 30% calcium oxalate dihydrate.  She returns today for follow-up.  She continues on daily Macrobid.  She is having some stent related symptoms with frequency, urgency, and nocturia.  No flank pain.  No fevers or chills.  No gross hematuria.  Portions of the above documentation were copied from a prior visit for review purposes only.  Past Medical History:  Past Medical History:  Diagnosis Date   Arthritis    back, legs, knees   Cancer ([place]) 1984   uterine   History of kidney stones    Hypothyroidism    Thyroid disease     Past Surgical History:  Past Surgical History:  Procedure Laterality Date   ABDOMINAL HYSTERECTOMY     CARPAL TUNNEL RELEASE Right    CHOLECYSTECTOMY     COLONOSCOPY N/A 09/23/2018   Procedure: COLONOSCOPY;   Surgeon: Rogene Houston, MD;  [facility];  Service: Endoscopy;  Laterality: N/A;  10:30   CYSTOSCOPY WITH RETROGRADE PYELOGRAM, URETEROSCOPY AND STENT PLACEMENT Right 01/08/2022   Procedure: CYSTOSCOPY WITH RETROGRADE PYELOGRAM, URETEROSCOPY AND STENT PLACEMENT;  Surgeon: Ardis Hughs, MD;  Location: [facility];  Service: Urology;  Laterality: Right;   CYSTOSCOPY WITH RETROGRADE PYELOGRAM, URETEROSCOPY AND STENT PLACEMENT Right 02/01/2022   Procedure: CYSTOSCOPY WITH RETROGRADE PYELOGRAM, URETEROSCOPY AND STENT EXCHANGE;  Surgeon: Primus Bravo., MD;  Location: [facility];  Service: Urology;  Laterality: Right;   HOLMIUM LASER APPLICATION Right 4/40/3474   Procedure: HOLMIUM LASER APPLICATION;  Surgeon: Primus Bravo., MD;  Location: [facility];  Service: Urology;  Laterality: Right;   POLYPECTOMY  09/23/2018   Procedure: POLYPECTOMY;  Surgeon: Rogene Houston, MD;  [facility];  Service: Endoscopy;;  CS and BX   STONE EXTRACTION WITH BASKET Right 02/01/2022   Procedure: STONE EXTRACTION WITH BASKET;  Surgeon: Primus Bravo., MD;  Location: [facility];  Service: Urology;  Laterality: Right;   TONSILLECTOMY      Allergies:  Allergies  Allergen Reactions   Codeine Nausea Only    Family History:  No family history on file.  Social History:  Social History   Tobacco Use   Smoking status: Former   Smokeless tobacco: Never  Scientific laboratory technician Use: Never used  Substance Use Topics   Alcohol use: No   Drug use: No    ROS: Constitutional:  Negative for fever, chills, weight loss CV: Negative for chest pain, previous MI, hypertension Respiratory:  Negative for shortness of breath, wheezing, sleep apnea, frequent cough GI:  Negative for nausea, vomiting, bloody stool, GERD  Physical exam: BP (!) 187/104   Pulse 98   Ht '5\' 7"'$  (1.702 m)   Wt 300 lb (136.1 kg)   BMI 46.99 kg/m  GENERAL APPEARANCE:  Well appearing, well developed, well nourished,  NAD HEENT:  Atraumatic, normocephalic, oropharynx clear NECK:  Supple without lymphadenopathy or thyromegaly ABDOMEN:  Soft, non-tender, no masses EXTREMITIES:  Moves all extremities well, without clubbing, cyanosis, or edema NEUROLOGIC:  Alert and oriented x 3, normal gait, CN II-XII grossly intact MENTAL STATUS:  appropriate BACK:  Non-tender to palpation, No CVAT SKIN:  Warm, dry, and intact  Results: U/A: >30 RBC, >30 WBC, mod bacteria, nitrite +

## 2022-02-14 ENCOUNTER — Encounter: Payer: Self-pay | Admitting: Urology

## 2022-02-14 ENCOUNTER — Telehealth: Payer: Self-pay

## 2022-02-14 ENCOUNTER — Ambulatory Visit: Payer: 59 | Admitting: Urology

## 2022-02-14 LAB — URINE CULTURE

## 2022-02-14 MED ORDER — CIPROFLOXACIN HCL 500 MG PO TABS: 500.0000 mg | ORAL_TABLET | Freq: Two times a day (BID) | ORAL | 0 refills | Status: DC

## 2022-02-14 NOTE — Telephone Encounter (Signed)
Patient called with answer message left to return call to office in a.m. and to check my chart message.

## 2022-02-14 NOTE — Addendum Note (Signed)
Addended by: Primus Bravo on: 02/14/2022 04:33 PM   Modules accepted: Orders

## 2022-02-14 NOTE — Telephone Encounter (Signed)
-----   Message from Primus Bravo, MD sent at 02/14/2022  4:33 PM EDT ----- Please notify Alexa [name] to begin Cipro BID x 5 days for UTI treatment.  She can stop Macrobid.

## 2022-02-15 ENCOUNTER — Other Ambulatory Visit: Payer: Self-pay

## 2022-02-15 DIAGNOSIS — R8290 Unspecified abnormal findings in urine: Secondary | ICD-10-CM

## 2022-02-15 MED ORDER — CIPROFLOXACIN HCL 500 MG PO TABS: 500.0000 mg | ORAL_TABLET | Freq: Two times a day (BID) | ORAL | 0 refills | Status: AC

## 2022-02-15 NOTE — Progress Notes (Signed)
Cipro sent to wrong pharmacy, updated rx and patient aware.

## 2022-02-28 ENCOUNTER — Ambulatory Visit (HOSPITAL_COMMUNITY)
Admission: RE | Admit: 2022-02-28 | Discharge: 2022-02-28 | Disposition: A | Discharge status: Home / Self Care | Payer: 59 | Source: Ambulatory Visit | Attending: Urology | Admitting: Urology

## 2022-02-28 ENCOUNTER — Encounter: Payer: Self-pay | Admitting: Urology

## 2022-02-28 ENCOUNTER — Ambulatory Visit (INDEPENDENT_AMBULATORY_CARE_PROVIDER_SITE_OTHER): Payer: 59 | Admitting: Urology

## 2022-02-28 VITALS — BP 157/82 | HR 77

## 2022-02-28 DIAGNOSIS — N200 Calculus of kidney: Secondary | ICD-10-CM

## 2022-02-28 DIAGNOSIS — Z466 Encounter for fitting and adjustment of urinary device: Secondary | ICD-10-CM | POA: Diagnosis not present

## 2022-02-28 DIAGNOSIS — Z9049 Acquired absence of other specified parts of digestive tract: Secondary | ICD-10-CM | POA: Diagnosis not present

## 2022-02-28 DIAGNOSIS — Z87440 Personal history of urinary (tract) infections: Secondary | ICD-10-CM

## 2022-02-28 DIAGNOSIS — Z960 Presence of urogenital implants: Secondary | ICD-10-CM | POA: Diagnosis not present

## 2022-02-28 LAB — URINALYSIS, ROUTINE W REFLEX MICROSCOPIC
Bilirubin, UA: NEGATIVE
Glucose, UA: NEGATIVE
Ketones, UA: NEGATIVE
Nitrite, UA: NEGATIVE
Protein,UA: NEGATIVE
RBC, UA: NEGATIVE
Specific Gravity, UA: 1.015 (ref 1.005–1.030)
Urobilinogen, Ur: 0.2 mg/dL (ref 0.2–1.0)
pH, UA: 5 (ref 5.0–7.5)

## 2022-02-28 LAB — MICROSCOPIC EXAMINATION
RBC, Urine: NONE SEEN /hpf (ref 0–2)
Renal Epithel, UA: NONE SEEN /hpf

## 2022-03-03 DIAGNOSIS — I10 Essential (primary) hypertension: Secondary | ICD-10-CM | POA: Diagnosis not present

## 2022-03-03 DIAGNOSIS — R7301 Impaired fasting glucose: Secondary | ICD-10-CM | POA: Diagnosis not present

## 2022-03-03 DIAGNOSIS — E039 Hypothyroidism, unspecified: Secondary | ICD-10-CM | POA: Diagnosis not present

## 2022-03-08 DIAGNOSIS — R310 Gross hematuria: Secondary | ICD-10-CM | POA: Diagnosis not present

## 2022-03-08 DIAGNOSIS — R0602 Shortness of breath: Secondary | ICD-10-CM | POA: Diagnosis not present

## 2022-03-08 DIAGNOSIS — R7303 Prediabetes: Secondary | ICD-10-CM | POA: Diagnosis not present

## 2022-03-08 DIAGNOSIS — Z0001 Encounter for general adult medical examination with abnormal findings: Secondary | ICD-10-CM | POA: Diagnosis not present

## 2022-03-08 DIAGNOSIS — M25562 Pain in left knee: Secondary | ICD-10-CM | POA: Diagnosis not present

## 2022-03-08 DIAGNOSIS — I10 Essential (primary) hypertension: Secondary | ICD-10-CM | POA: Diagnosis not present

## 2022-03-08 DIAGNOSIS — Z6841 Body Mass Index (BMI) 40.0 and over, adult: Secondary | ICD-10-CM | POA: Diagnosis not present

## 2022-03-08 DIAGNOSIS — R161 Splenomegaly, not elsewhere classified: Secondary | ICD-10-CM | POA: Diagnosis not present

## 2022-03-08 DIAGNOSIS — E559 Vitamin D deficiency, unspecified: Secondary | ICD-10-CM | POA: Diagnosis not present

## 2022-03-08 DIAGNOSIS — K760 Fatty (change of) liver, not elsewhere classified: Secondary | ICD-10-CM | POA: Diagnosis not present

## 2022-03-08 DIAGNOSIS — E039 Hypothyroidism, unspecified: Secondary | ICD-10-CM | POA: Diagnosis not present

## 2022-03-08 DIAGNOSIS — R800 Isolated proteinuria: Secondary | ICD-10-CM | POA: Diagnosis not present

## 2022-03-27 ENCOUNTER — Ambulatory Visit: Payer: 59 | Admitting: Urology

## 2022-04-18 ENCOUNTER — Ambulatory Visit (INDEPENDENT_AMBULATORY_CARE_PROVIDER_SITE_OTHER): Payer: 59 | Admitting: Orthopedic Surgery

## 2022-04-18 DIAGNOSIS — M170 Bilateral primary osteoarthritis of knee: Secondary | ICD-10-CM | POA: Diagnosis not present

## 2022-04-18 DIAGNOSIS — M1712 Unilateral primary osteoarthritis, left knee: Secondary | ICD-10-CM

## 2022-04-18 DIAGNOSIS — Z6841 Body Mass Index (BMI) 40.0 and over, adult: Secondary | ICD-10-CM | POA: Diagnosis not present

## 2022-04-18 DIAGNOSIS — M1711 Unilateral primary osteoarthritis, right knee: Secondary | ICD-10-CM

## 2022-04-18 NOTE — Progress Notes (Signed)
Orthopaedic Clinic Return  Assessment: Alexa [name] is a 61 y.o. female with the following: Severe bilateral knee arthritis  Plan: Alexa [name] previously had good relief following injections greater than 1 year ago.  She states progressively worsening pain, and is interested in repeat injections.  She also asked about hyaluronic acid injections.  We will complete steroid injections today, and initiate authorization for HA injections.  Follow-up as needed, but no sooner than 3 months.  Procedure note injection Right knee joint   Verbal consent was obtained to inject the right knee joint  Timeout was completed to confirm the site of injection.  The skin was prepped with alcohol and ethyl chloride was sprayed at the injection site.  A 21-gauge needle was used to inject 40 mg of Depo-Medrol and 1% lidocaine (3 cc) into the right knee using an anterolateral approach.  There were no complications. A sterile bandage was applied.   Procedure note injection Left knee joint   Verbal consent was obtained to inject the left knee joint  Timeout was completed to confirm the site of injection.  The skin was prepped with alcohol and ethyl chloride was sprayed at the injection site.  A 21-gauge needle was used to inject 40 mg of Depo-Medrol and 1% lidocaine (3 cc) into the left knee using an anterolateral approach.  There were no complications. A sterile bandage was applied.   Follow-up: Return if symptoms worsen or fail to improve.   Subjective:  Chief Complaint  Patient presents with   Knee Pain    Bilat/ the right hurts more than the left. Here for injections.    History of Present Illness: Alexa [name] is a 62 y.o. female who returns to clinic today for repeat evaluation of bilateral knee pain.  I last saw her in clinic greater than 1 year ago.  She had bilateral knee injections at that time.  She had good relief of her symptoms.  These have lasted several months.  She also notes that  she was hospitalized due to urosepsis, and states that her knees felt very good while she was hospitalized.  Since then, she has had progressive worsening of pain in both knees.  Right is worse than left.  She would like to proceed with steroid injections today.  She also inquired about hyaluronic acid injections, as she knows someone who has had them recently.   Review of Systems: No fevers or chills No numbness or tingling No chest pain No shortness of breath No bowel or bladder dysfunction No GI distress No headaches    Objective: There were no vitals taken for this visit.  Physical Exam:  Alert and oriented.  No acute distress.  Obese female.  Slow, waddling gait.  No assistive device  Evaluation of bilateral knees demonstrates varus overall alignment.  Tenderness to palpation on the medial joint lines.  Range of motion restricted from 5 to 110 degrees.  No obvious laxity to varus or valgus stress.  She is able to maintain a straight leg raise.  Sensation is intact distally.  IMAGING: I personally ordered and reviewed the following images:  No new imaging obtained today.  Mordecai Rasmussen, MD 04/18/2022 1:25 PM

## 2022-04-18 NOTE — Patient Instructions (Signed)

## 2022-04-25 ENCOUNTER — Ambulatory Visit (INDEPENDENT_AMBULATORY_CARE_PROVIDER_SITE_OTHER): Payer: 59 | Admitting: Urology

## 2022-04-25 ENCOUNTER — Ambulatory Visit (HOSPITAL_COMMUNITY)
Admission: RE | Admit: 2022-04-25 | Discharge: 2022-04-25 | Disposition: A | Discharge status: Home / Self Care | Payer: 59 | Source: Ambulatory Visit | Attending: Urology | Admitting: Urology

## 2022-04-25 ENCOUNTER — Encounter: Payer: Self-pay | Admitting: Urology

## 2022-04-25 VITALS — BP 181/105 | Ht 67.0 in | Wt 300.0 lb

## 2022-04-25 DIAGNOSIS — Z87440 Personal history of urinary (tract) infections: Secondary | ICD-10-CM

## 2022-04-25 DIAGNOSIS — R310 Gross hematuria: Secondary | ICD-10-CM | POA: Diagnosis not present

## 2022-04-25 DIAGNOSIS — N200 Calculus of kidney: Secondary | ICD-10-CM | POA: Diagnosis not present

## 2022-04-25 DIAGNOSIS — Z9049 Acquired absence of other specified parts of digestive tract: Secondary | ICD-10-CM | POA: Diagnosis not present

## 2022-04-25 DIAGNOSIS — Z87898 Personal history of other specified conditions: Secondary | ICD-10-CM

## 2022-04-25 DIAGNOSIS — Z87442 Personal history of urinary calculi: Secondary | ICD-10-CM | POA: Diagnosis not present

## 2022-04-25 LAB — MICROSCOPIC EXAMINATION: Renal Epithel, UA: NONE SEEN /hpf

## 2022-04-25 LAB — URINALYSIS, ROUTINE W REFLEX MICROSCOPIC
Bilirubin, UA: NEGATIVE
Glucose, UA: NEGATIVE
Ketones, UA: NEGATIVE
Nitrite, UA: NEGATIVE
Protein,UA: NEGATIVE
Specific Gravity, UA: 1.015 (ref 1.005–1.030)
Urobilinogen, Ur: 0.2 mg/dL (ref 0.2–1.0)
pH, UA: 7 (ref 5.0–7.5)

## 2022-04-25 NOTE — Progress Notes (Signed)
Assessment: 1. Nephrolithiasis   2. History of UTI   3. History of gross hematuria     Plan: I reviewed the KUB from today with results as noted below. Stone prevention discussed and information provided. Schedule for renal ultrasound at [facility] in the next month.  We will call with results. Return to office in 3 months.   Chief Complaint:  Chief Complaint  Patient presents with   Nephrolithiasis    History of Present Illness:   Alexa [name] is a 61 y.o. year old female who is seen for further evaluation of nephrolithiasis.   She has a history of intermittent episodes of gross hematuria for the past 18 months.  These episodes typically lasted approximately 1 week and resolved spontaneously. No dysuria or flank pain associated with these episodes. Dipstick urinalysis from 09/09/2021 demonstrated large blood, nitrite positive.  No urine culture results available.  She was treated with Cipro which she took for 2 days then discontinued.  She reported being treated for 5 UTIs over the past 18 months.  These were based on urinalysis results.  No prior history of UTIs. Creatinine 0.89 from 09/09/2021. She has symptoms of nocturia and occasional urgency.  She has a history of nephrolithiasis.  CT imaging from 8/16 demonstrated a 3 mm right UPJ calculus with associated right hydronephrosis and a tiny calculus in the right renal pelvis.  This was her last stone episode.    She has a prior history of tobacco use smoking less than 1 pack/day for approximately 20 years.  She quit 30 years ago.  No other toxic exposures.  She is a school bus driver.  Urine culture from 09/26/2021 grew >100 K E. coli.  She was treated with Macrobid. CT hematuria protocol from 10/26/2021 showed a 13 x 8 mm calculus in the right renal pelvis and an additional 2-3 mm calculus in the interpolar right kidney without obstruction. She was scheduled for cystoscopy on 11/11/2021.  She noted onset of gross hematuria  several days prior to her visit.  She also had some intermittent right-sided flank pain.   Cystoscopy was postponed due to apparent UTI.  Urine culture grew >100 K E. coli.  She was treated with Bactrim and started on daily Macrobid. Cystoscopy from 11/25/2021 demonstrated no urethral or bladder abnormalities.  Treatment of the right renal calculus was discussed with the patient.  She was scheduled for right ESL but elected to postpone treatment until sometime in late May or early June. She developed right-sided flank pain and was scheduled for right ESL on 01/10/2022. She presented to the emergency room on 01/04/2022 with right-sided flank pain.  Urinalysis demonstrated many bacteria.  No urine culture was performed.  No imaging was performed. She returned to the emergency room on 01/07/2022 with fever and chills.  She was found to be septic and was transferred to [facility]. Urine culture grew >100 K E. coli. She underwent right ureteral stent placement by Dr. Louis Meckel on 01/08/2022.  She was discharged from the hospital on 01/11/2022.  She is status post right ureteroscopic laser lithotripsy with stent exchange on 02/01/2022.  The stone was completely fragmented. Stone analysis showed 70% calcium oxalate monohydrate and 30% calcium oxalate dihydrate. Her stent was removed on 02/09/22. Urine culture from 02/09/22 grew 50-100K Enterobacter.  Treated with Cipro.    At her visit in June 2023, she was doing well following her stent removal.  She continued to have some frequency and urgency.  No dysuria  or gross hematuria.  She had completed her antibiotics.  No flank pain.  She was not aware of passing stone fragments but did not strain her urine.  She returns today for follow-up.  No stone symptoms.  No flank pain, dysuria, or gross hematuria. KUB today shows small fragments in the area of the right renal shadow, primarily lower renal shadow.  Portions of the above documentation were copied from a prior visit for  review purposes only.   Past Medical History:  Past Medical History:  Diagnosis Date   Arthritis    back, legs, knees   Cancer ([place]) 1984   uterine   History of kidney stones    Hypothyroidism    Thyroid disease     Past Surgical History:  Past Surgical History:  Procedure Laterality Date   ABDOMINAL HYSTERECTOMY     CARPAL TUNNEL RELEASE Right    CHOLECYSTECTOMY     COLONOSCOPY N/A 09/23/2018   Procedure: COLONOSCOPY;  Surgeon: Rogene Houston, MD;  [facility];  Service: Endoscopy;  Laterality: N/A;  10:30   CYSTOSCOPY WITH RETROGRADE PYELOGRAM, URETEROSCOPY AND STENT PLACEMENT Right 01/08/2022   Procedure: CYSTOSCOPY WITH RETROGRADE PYELOGRAM, URETEROSCOPY AND STENT PLACEMENT;  Surgeon: Ardis Hughs, MD;  Location: [facility];  Service: Urology;  Laterality: Right;   CYSTOSCOPY WITH RETROGRADE PYELOGRAM, URETEROSCOPY AND STENT PLACEMENT Right 02/01/2022   Procedure: CYSTOSCOPY WITH RETROGRADE PYELOGRAM, URETEROSCOPY AND STENT EXCHANGE;  Surgeon: Primus Bravo., MD;  Location: [facility];  Service: Urology;  Laterality: Right;   HOLMIUM LASER APPLICATION Right 5/36/6440   Procedure: HOLMIUM LASER APPLICATION;  Surgeon: Primus Bravo., MD;  Location: [facility];  Service: Urology;  Laterality: Right;   POLYPECTOMY  09/23/2018   Procedure: POLYPECTOMY;  Surgeon: Rogene Houston, MD;  [facility];  Service: Endoscopy;;  CS and BX   STONE EXTRACTION WITH BASKET Right 02/01/2022   Procedure: STONE EXTRACTION WITH BASKET;  Surgeon: Primus Bravo., MD;  Location: [facility];  Service: Urology;  Laterality: Right;   TONSILLECTOMY      Allergies:  Allergies  Allergen Reactions   Codeine Nausea Only    Family History:  History reviewed. No pertinent family history.  Social History:  Social History   Tobacco Use   Smoking status: Former   Smokeless tobacco: Never  Scientific laboratory technician Use: Never used  Substance Use Topics   Alcohol use: No    Drug use: No    ROS: Constitutional:  Negative for fever, chills, weight loss CV: Negative for chest pain, previous MI, hypertension Respiratory:  Negative for shortness of breath, wheezing, sleep apnea, frequent cough GI:  Negative for nausea, vomiting, bloody stool, GERD  Physical exam: BP (!) 181/105   Ht '5\' 7"'$  (1.702 m)   Wt 300 lb (136.1 kg)   BMI 46.99 kg/m  GENERAL APPEARANCE:  Well appearing, well developed, well nourished, NAD HEENT:  Atraumatic, normocephalic, oropharynx clear NECK:  Supple without lymphadenopathy or thyromegaly ABDOMEN:  Soft, non-tender, no masses EXTREMITIES:  Moves all extremities well, without clubbing, cyanosis, or edema NEUROLOGIC:  Alert and oriented x 3, normal gait, CN II-XII grossly intact MENTAL STATUS:  appropriate BACK:  Non-tender to palpation, No CVAT SKIN:  Warm, dry, and intact  Results: U/A: 11-30 WBCs, 3-10 RBCs, moderate bacteria

## 2022-05-26 ENCOUNTER — Ambulatory Visit (HOSPITAL_COMMUNITY)
Admission: RE | Admit: 2022-05-26 | Discharge: 2022-05-26 | Disposition: A | Discharge status: Home / Self Care | Payer: 59 | Source: Ambulatory Visit | Attending: Urology | Admitting: Urology

## 2022-05-26 DIAGNOSIS — N200 Calculus of kidney: Secondary | ICD-10-CM

## 2022-05-29 ENCOUNTER — Encounter: Payer: Self-pay | Admitting: Urology

## 2022-06-08 DIAGNOSIS — E039 Hypothyroidism, unspecified: Secondary | ICD-10-CM | POA: Diagnosis not present

## 2022-06-08 DIAGNOSIS — I10 Essential (primary) hypertension: Secondary | ICD-10-CM | POA: Diagnosis not present

## 2022-06-12 ENCOUNTER — Encounter: Payer: Self-pay | Admitting: Urology

## 2022-07-20 DIAGNOSIS — I10 Essential (primary) hypertension: Secondary | ICD-10-CM | POA: Diagnosis not present

## 2022-07-24 DIAGNOSIS — I10 Essential (primary) hypertension: Secondary | ICD-10-CM | POA: Diagnosis not present

## 2022-07-25 ENCOUNTER — Encounter: Payer: Self-pay | Admitting: Orthopedic Surgery

## 2022-07-25 ENCOUNTER — Ambulatory Visit (INDEPENDENT_AMBULATORY_CARE_PROVIDER_SITE_OTHER): Payer: 59 | Admitting: Orthopedic Surgery

## 2022-07-25 DIAGNOSIS — M1711 Unilateral primary osteoarthritis, right knee: Secondary | ICD-10-CM

## 2022-07-25 DIAGNOSIS — M1712 Unilateral primary osteoarthritis, left knee: Secondary | ICD-10-CM | POA: Diagnosis not present

## 2022-07-25 NOTE — Patient Instructions (Signed)

## 2022-07-25 NOTE — Progress Notes (Signed)
Orthopaedic Clinic Return  Assessment: Alexa [name] is a 61 y.o. female with the following: Severe bilateral knee arthritis  Plan: Alexa [name] previously had good relief following injections approximately 3 months ago.  Injection provided greater than 2 months worth of relief.  She would like to be repeat injections today.  These were completed without issues.  Procedure note injection Right knee joint   Verbal consent was obtained to inject the right knee joint  Timeout was completed to confirm the site of injection.  The skin was prepped with alcohol and ethyl chloride was sprayed at the injection site.  A 21-gauge needle was used to inject 40 mg of Depo-Medrol and 1% lidocaine (3 cc) into the right knee using an anterolateral approach.  There were no complications. A sterile bandage was applied.   Procedure note injection Left knee joint   Verbal consent was obtained to inject the left knee joint  Timeout was completed to confirm the site of injection.  The skin was prepped with alcohol and ethyl chloride was sprayed at the injection site.  A 21-gauge needle was used to inject 40 mg of Depo-Medrol and 1% lidocaine (3 cc) into the left knee using an anterolateral approach.  There were no complications. A sterile bandage was applied.   Follow-up: Return if symptoms worsen or fail to improve.   Subjective:  Chief Complaint  Patient presents with   Knee Pain    Bil knee pain, requests injections. Last done 04/2022.    History of Present Illness: Alexa [name] is a 61 y.o. female who returns to clinic today for repeat evaluation of bilateral knee pain.  Last visit was approximately 3 months ago.  She had injections of both knees, and this provided excellent relief of her symptoms for at least 2 months.  Over the past 3 weeks, her pain is gotten a lot worse.  She is requesting bilateral knee injections today.  Review of Systems: No fevers or chills No numbness or  tingling No chest pain No shortness of breath No bowel or bladder dysfunction No GI distress No headaches    Objective: There were no vitals taken for this visit.  Physical Exam:  Alert and oriented.  No acute distress.  Obese female.  Slow, waddling gait.  No assistive device  Evaluation of bilateral knees demonstrates varus overall alignment.  Tenderness to palpation on the medial joint lines.  Range of motion restricted from 5 to 110 degrees.  No obvious laxity to varus or valgus stress.  She is able to maintain a straight leg raise.  Sensation is intact distally.  IMAGING: I personally ordered and reviewed the following images:  No new imaging obtained today.  Mordecai Rasmussen, MD 07/25/2022 11:31 AM

## 2022-07-30 ENCOUNTER — Other Ambulatory Visit: Payer: Self-pay

## 2022-07-30 ENCOUNTER — Emergency Department (HOSPITAL_COMMUNITY)
Admission: EM | Admit: 2022-07-30 | Discharge: 2022-07-30 | Disposition: A | Discharge status: Home / Self Care | Payer: 59 | Attending: Emergency Medicine | Admitting: Emergency Medicine

## 2022-07-30 ENCOUNTER — Encounter (HOSPITAL_COMMUNITY): Payer: Self-pay | Admitting: *Deleted

## 2022-07-30 DIAGNOSIS — I10 Essential (primary) hypertension: Secondary | ICD-10-CM | POA: Diagnosis not present

## 2022-07-30 DIAGNOSIS — Z79899 Other long term (current) drug therapy: Secondary | ICD-10-CM | POA: Diagnosis not present

## 2022-07-30 DIAGNOSIS — R030 Elevated blood-pressure reading, without diagnosis of hypertension: Secondary | ICD-10-CM | POA: Diagnosis not present

## 2022-07-30 DIAGNOSIS — R42 Dizziness and giddiness: Secondary | ICD-10-CM | POA: Diagnosis not present

## 2022-07-30 HISTORY — DX: Essential (primary) hypertension: I10

## 2022-07-30 LAB — BASIC METABOLIC PANEL
Anion gap: 10 (ref 5–15)
BUN: 17 mg/dL (ref 8–23)
CO2: 27 mmol/L (ref 22–32)
Calcium: 9.1 mg/dL (ref 8.9–10.3)
Chloride: 103 mmol/L (ref 98–111)
Creatinine, Ser: 0.9 mg/dL (ref 0.44–1.00)
GFR, Estimated: >60 mL/min (ref >60)
Glucose, Bld: 100 mg/dL — ABNORMAL HIGH (ref 70–99)
Potassium: 4.1 mmol/L (ref 3.5–5.1)
Sodium: 140 mmol/L (ref 135–145)

## 2022-07-30 LAB — CBC WITH DIFFERENTIAL/PLATELET
Abs Immature Granulocytes: 0.02 10*3/uL (ref 0.00–0.07)
Basophils Absolute: 0 10*3/uL (ref 0.0–0.1)
Basophils Relative: 0 %
Eosinophils Absolute: 0.1 10*3/uL (ref 0.0–0.5)
Eosinophils Relative: 2 %
HCT: 41.5 % (ref 36.0–46.0)
Hemoglobin: 13.5 g/dL (ref 12.0–15.0)
Immature Granulocytes: 0 %
Lymphocytes Relative: 25 %
Lymphs Abs: 1.8 10*3/uL (ref 0.7–4.0)
MCH: 28.5 pg (ref 26.0–34.0)
MCHC: 32.5 g/dL (ref 30.0–36.0)
MCV: 87.7 fL (ref 80.0–100.0)
Monocytes Absolute: 0.4 10*3/uL (ref 0.1–1.0)
Monocytes Relative: 5 %
Neutro Abs: 5 10*3/uL (ref 1.7–7.7)
Neutrophils Relative %: 68 %
Platelets: 161 10*3/uL (ref 150–400)
RBC: 4.73 MIL/uL (ref 3.87–5.11)
RDW: 14.1 % (ref 11.5–15.5)
WBC: 7.4 10*3/uL (ref 4.0–10.5)
nRBC: 0 % (ref 0.0–0.2)

## 2022-07-30 MED ORDER — AMLODIPINE BESYLATE 10 MG PO TABS: 10.0000 mg | ORAL_TABLET | Freq: Every day | ORAL | 0 refills | Status: DC

## 2022-07-30 NOTE — ED Triage Notes (Signed)
Pt with HTN, last 201/101 at home PTA.  Pt started on new medicine Losartan for HTN about a week ago and Norvasc. Denies any pain, tingling to left shoulder and dizziness off and on.

## 2022-07-30 NOTE — ED Provider Notes (Signed)
Patient is a 61 year old female recently started on antihypertensives after she was very resistant to starting anything over the summer when her doctor told her she needed something.  For about 3 weeks she has been taking losartan, they increased it from 25-100 because of poor control and then this last week they added 5 mg of amlodipine.  She was asymptomatic but today was feeling some dizziness so she took her blood pressure and found it to be over 200 on an old forearm blood pressure cuff of her husband's.  She got concerned and came to the ER.  On exam the patient has a blood pressure of 160/82 she is not tachycardic or febrile and has no symptoms including no chest pain shortness of breath swelling or any other symptoms at this time.  There is no headache dizziness numbness weakness or changes in speech.  She has no visual changes.  Her EKG is unremarkable, her labs are unremarkable, we will increase her amlodipine to 10 mg and have her follow-up in the outpatient setting.  She will need to get a formal blood pressure cuff and was advised to take her blood pressure once a day at the same time every day to share this with her doctor and to follow-up outpatient.  She is agreeable.  Medical screening examination/treatment/procedure(s) were conducted as a shared visit with non-physician practitioner(s) and myself.  I personally evaluated the patient during the encounter.  Clinical Impression:   Final diagnoses:  Hypertension, unspecified type         Noemi Chapel, MD 07/30/22 2056

## 2022-07-30 NOTE — ED Provider Notes (Signed)
[facility] EMERGENCY DEPARTMENT Provider Note   CSN: [id number] Arrival date & time: 07/30/22  1232     History Chief Complaint  Patient presents with   Hypertension    Alexa [name] is a 61 y.o. female.   Hypertension Pertinent negatives include no chest pain, no abdominal pain and no shortness of breath.  Patient reports several days of elevated blood pressure on home monitoring. She states that she was recently started on amlodipine after taking losartan as monotherapy for HTN. Recently increased from losartan '25mg'$  to '100mg'$  and had amlodipine '5mg'$  added to regimen. She reports that lowest BP has been at home has been 150/80s. She reports associated dizziness. Denies chest pain, shortness of breath, altered mental status, blurred vision, and changes in urine output or quality.      Home Medications Prior to Admission medications   Medication Sig Start Date End Date Taking? Authorizing Provider  amLODipine (NORVASC) 10 MG tablet Take 1 tablet (10 mg total) by mouth daily. 07/30/22  Yes Luvenia Heller, PA-C  famotidine (PEPCID) 20 MG tablet Take 1 tablet (20 mg total) by mouth 2 (two) times daily. 01/11/22   Pokhrel, Corrie Mckusick, MD  levothyroxine (SYNTHROID) 150 MCG tablet Take 150 mcg by mouth daily before breakfast.    [provider]  nystatin cream (MYCOSTATIN) Apply to groin area twice daily as needed for rash, itching 02/01/22   Stoneking, Reece Leader., MD  oxybutynin (DITROPAN) 5 MG tablet Take 1 tablet (5 mg total) by mouth 3 (three) times daily as needed for bladder spasms. 02/01/22   Stoneking, Reece Leader., MD  phenazopyridine (PYRIDIUM) 200 MG tablet Take 1 tablet (200 mg total) by mouth 3 (three) times daily as needed for pain. 02/01/22 02/01/23  Stoneking, Reece Leader., MD      Allergies    Codeine    Review of Systems   Review of Systems  Constitutional:  Negative for chills and fever.  Eyes:  Negative for visual disturbance.  Respiratory:  Negative for cough and  shortness of breath.   Cardiovascular:  Negative for chest pain and palpitations.  Gastrointestinal:  Negative for abdominal pain and vomiting.  Genitourinary:  Negative for decreased urine volume and frequency.  Neurological:  Positive for dizziness.  Psychiatric/Behavioral:  Negative for behavioral problems.   All other systems reviewed and are negative.   Physical Exam Updated Vital Signs BP (!) 160/82 (BP Location: Right Arm)   Pulse 78   Temp 97.7 F (36.5 C) (Oral)   Resp 20   Ht '5\' 7"'$  (1.702 m)   Wt 136.1 kg   SpO2 99%   BMI 46.99 kg/m  Physical Exam Vitals and nursing note reviewed.  Constitutional:      Appearance: Normal appearance. She is normal weight.  HENT:     Head: Normocephalic and atraumatic.  Eyes:     General: No scleral icterus.       Right eye: No discharge.        Left eye: No discharge.     Pupils: Pupils are equal, round, and reactive to light.  Cardiovascular:     Rate and Rhythm: Normal rate and regular rhythm.     Pulses: Normal pulses.     Heart sounds: Normal heart sounds.     No friction rub.  Pulmonary:     Effort: Pulmonary effort is normal.     Breath sounds: Normal breath sounds.  Musculoskeletal:        General: No swelling.  Right lower leg: No edema.     Left lower leg: No edema.  Neurological:     Mental Status: She is alert.     ED Results / Procedures / Treatments   Labs (all labs ordered are listed, but only abnormal results are displayed) Labs Reviewed  BASIC METABOLIC PANEL - Abnormal; Notable for the following components:      Result Value   Glucose, Bld 100 (*)    All other components within normal limits  CBC WITH DIFFERENTIAL/PLATELET    EKG EKG Interpretation  Date/Time:  'Sunday July 30 2022 12:45:14 EST Ventricular Rate:  77 PR Interval:  178 QRS Duration: 90 QT Interval:  394 QTC Calculation: 445 R Axis:   3 Text Interpretation: Normal sinus rhythm Normal ECG When compared with ECG of  07-Jan-2022 21:33, PREVIOUS ECG IS PRESENT Confirmed by Miller, Brian ([id number]) on 07/30/2022 3:01:38 PM  Radiology No results found.  Procedures Procedures   Medications Ordered in ED Medications - No data to display  ED Course/ Medical Decision Making/ A&P                           Medical Decision Making  This patient presents to the ED for concern of hypertension. Differential diagnosis includes Hypertensive emergency, acute kidney injury, renal artery stenosis, volume overload, urinary obstruction, and anxiety.    Additional history obtained:  Additional history obtained from husband External records from outside source obtained and reviewed including US renal showing evidence of non-obstructing stone and no evidence of hydronephrosis on 05/26/2022   Lab Tests:  I Ordered, and personally interpreted labs.  The pertinent results include:  CBC and BMP within normal limits.    Medicines ordered and prescription drug management:  I ordered medication including increase Amlodipine from 5mg to 10mg for HTN.  I have reviewed the patients home medicines and have made adjustments as needed. Will keep Losartan 100mg as prescribed.   Problem List / ED Course:  Patient presented with concerns regarding HTN. Patient was diagnosed with HTN a few weeks ago and started on Losartan 25mg. 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 Stated that she has been having some dizziness, but no chest pain, shortness of breath, blurred vision, changes in mental status, or signs of changes in urinary output or urgency. Advised patient that although blood pressure is elevated, this would be best managed with PCP but recommended increasing dose of amlodipine from 5mg to 10mg. Advised patient that she should return to ER if she begins to experience symptoms of hypertensive emergency including chest pain, shortness of breath,  altered mental status, blurred vision, or signs of acute kidney failure. Patient verbalized understanding and was agreeable to plan.    Final Clinical Impression(s) / ED Diagnoses Final diagnoses:  Hypertension, unspecified type    Rx / DC Orders ED Discharge Orders          Ordered    amLODipine (NORVASC) 10 MG tablet  Daily        11'$ /26/23 1540              Luvenia Heller, PA-C 07/30/22 1552    Noemi Chapel, MD 07/30/22 2056

## 2022-07-30 NOTE — Discharge Instructions (Addendum)
You were evaluated for hypertension today in the ER. Based on current BP levels, we recommend increasing dose of amlodipine from '5mg'$  to '10mg'$  until you are able to see your PCP for further management. Continue taking Losartan as prescribed. A new prescription for Amlodipine '10mg'$  was sent to your pharmacy that you can pick up if you are unable to see your PCP regarding dose changes within the next week.

## 2022-07-31 DIAGNOSIS — I10 Essential (primary) hypertension: Secondary | ICD-10-CM | POA: Diagnosis not present

## 2022-08-02 DIAGNOSIS — I10 Essential (primary) hypertension: Secondary | ICD-10-CM | POA: Diagnosis not present

## 2022-08-03 ENCOUNTER — Ambulatory Visit (INDEPENDENT_AMBULATORY_CARE_PROVIDER_SITE_OTHER): Payer: 59 | Admitting: Urology

## 2022-08-03 ENCOUNTER — Encounter: Payer: Self-pay | Admitting: Urology

## 2022-08-03 VITALS — BP 150/71 | HR 90 | Ht 67.0 in | Wt 300.0 lb

## 2022-08-03 DIAGNOSIS — Z87440 Personal history of urinary (tract) infections: Secondary | ICD-10-CM | POA: Diagnosis not present

## 2022-08-03 DIAGNOSIS — R8290 Unspecified abnormal findings in urine: Secondary | ICD-10-CM | POA: Diagnosis not present

## 2022-08-03 DIAGNOSIS — N200 Calculus of kidney: Secondary | ICD-10-CM

## 2022-08-03 LAB — URINALYSIS, ROUTINE W REFLEX MICROSCOPIC
Bilirubin, UA: NEGATIVE
Glucose, UA: NEGATIVE
Ketones, UA: NEGATIVE
Nitrite, UA: POSITIVE — AB
RBC, UA: NEGATIVE
Specific Gravity, UA: 1.025 (ref 1.005–1.030)
Urobilinogen, Ur: 0.2 mg/dL (ref 0.2–1.0)
pH, UA: 6 (ref 5.0–7.5)

## 2022-08-03 LAB — MICROSCOPIC EXAMINATION: WBC, UA: >30 /hpf — AB (ref 0–5)

## 2022-08-03 NOTE — Progress Notes (Signed)
Assessment: 1. Nephrolithiasis   2. History of UTI   3. Abnormal urine findings      Plan: Urine culture sent today.  We will contact her with results. Continue stone prevention  Return to office in 3 months with KUB.  Chief Complaint:  Chief Complaint  Patient presents with   Nephrolithiasis    History of Present Illness:   Alexa [name] is a 61 y.o. year old female who is seen for further evaluation of nephrolithiasis.   She has a history of intermittent episodes of gross hematuria for the past 18 months.  These episodes typically lasted approximately 1 week and resolved spontaneously. No dysuria or flank pain associated with these episodes. Dipstick urinalysis from 09/09/2021 demonstrated large blood, nitrite positive.  No urine culture results available.  She was treated with Cipro which she took for 2 days then discontinued.  She reported being treated for 5 UTIs over the past 18 months.  These were based on urinalysis results.  No prior history of UTIs. Creatinine 0.89 from 09/09/2021. She has symptoms of nocturia and occasional urgency.  She has a history of nephrolithiasis.  CT imaging from 8/16 demonstrated a 3 mm right UPJ calculus with associated right hydronephrosis and a tiny calculus in the right renal pelvis.  This was her last stone episode.    She has a prior history of tobacco use smoking less than 1 pack/day for approximately 20 years.  She quit 30 years ago.  No other toxic exposures.  She is a school bus driver.  Urine culture from 09/26/2021 grew >100 K E. coli.  She was treated with Macrobid. CT hematuria protocol from 10/26/2021 showed a 13 x 8 mm calculus in the right renal pelvis and an additional 2-3 mm calculus in the interpolar right kidney without obstruction. She was scheduled for cystoscopy on 11/11/2021.  She noted onset of gross hematuria several days prior to her visit.  She also had some intermittent right-sided flank pain.   Cystoscopy was  postponed due to apparent UTI.  Urine culture grew >100 K E. coli.  She was treated with Bactrim and started on daily Macrobid. Cystoscopy from 11/25/2021 demonstrated no urethral or bladder abnormalities.  Treatment of the right renal calculus was discussed with the patient.  She was scheduled for right ESL but elected to postpone treatment until sometime in late May or early June. She developed right-sided flank pain and was scheduled for right ESL on 01/10/2022. She presented to the emergency room on 01/04/2022 with right-sided flank pain.  Urinalysis demonstrated many bacteria.  No urine culture was performed.  No imaging was performed. She returned to the emergency room on 01/07/2022 with fever and chills.  She was found to be septic and was transferred to [facility]. Urine culture grew >100 K E. coli. She underwent right ureteral stent placement by Dr. Louis Meckel on 01/08/2022.  She was discharged from the hospital on 01/11/2022.  She is status post right ureteroscopic laser lithotripsy with stent exchange on 02/01/2022.  The stone was completely fragmented. Stone analysis showed 70% calcium oxalate monohydrate and 30% calcium oxalate dihydrate. Her stent was removed on 02/09/22. Urine culture from 02/09/22 grew 50-100K Enterobacter.  Treated with Cipro.    At her visit in June 2023, she was doing well following her stent removal.  She continued to have some frequency and urgency.  No dysuria or gross hematuria.  She had completed her antibiotics.  No flank pain.  She was not aware  of passing stone fragments but did not strain her urine. KUB from 04/25/2022 showed a 4 mm calcification in the right lower renal shadow. Renal ultrasound from 05/26/2022 showed a apparent small stone in both kidneys, no evidence of renal mass or obstruction.  She returns today for follow-up.  No gross hematuria.  No dysuria.  She has noted an occasional discomfort in the right flank area.  No other urinary symptoms.  Portions of  the above documentation were copied from a prior visit for review purposes only.   Past Medical History:  Past Medical History:  Diagnosis Date   Arthritis    back, legs, knees   Cancer ([place]) 1984   uterine   History of kidney stones    Hypertension    Hypothyroidism    Thyroid disease     Past Surgical History:  Past Surgical History:  Procedure Laterality Date   ABDOMINAL HYSTERECTOMY     CARPAL TUNNEL RELEASE Right    CHOLECYSTECTOMY     COLONOSCOPY N/A 09/23/2018   Procedure: COLONOSCOPY;  Surgeon: Rogene Houston, MD;  Location: [facility];  Service: Endoscopy;  Laterality: N/A;  10:30   CYSTOSCOPY WITH RETROGRADE PYELOGRAM, URETEROSCOPY AND STENT PLACEMENT Right 01/08/2022   Procedure: CYSTOSCOPY WITH RETROGRADE PYELOGRAM, URETEROSCOPY AND STENT PLACEMENT;  Surgeon: Ardis Hughs, MD;  Location: [facility];  Service: Urology;  Laterality: Right;   CYSTOSCOPY WITH RETROGRADE PYELOGRAM, URETEROSCOPY AND STENT PLACEMENT Right 02/01/2022   Procedure: CYSTOSCOPY WITH RETROGRADE PYELOGRAM, URETEROSCOPY AND STENT EXCHANGE;  Surgeon: Primus Bravo., MD;  Location: [facility];  Service: Urology;  Laterality: Right;   HOLMIUM LASER APPLICATION Right 4/58/0998   Procedure: HOLMIUM LASER APPLICATION;  Surgeon: Primus Bravo., MD;  Location: [facility];  Service: Urology;  Laterality: Right;   POLYPECTOMY  09/23/2018   Procedure: POLYPECTOMY;  Surgeon: Rogene Houston, MD;  Location: [facility];  Service: Endoscopy;;  CS and BX   STONE EXTRACTION WITH BASKET Right 02/01/2022   Procedure: STONE EXTRACTION WITH BASKET;  Surgeon: Primus Bravo., MD;  Location: [facility];  Service: Urology;  Laterality: Right;   TONSILLECTOMY      Allergies:  Allergies  Allergen Reactions   Codeine Nausea Only    Family History:  No family history on file.  Social History:  Social History   Tobacco Use   Smoking status: Former   Smokeless tobacco: Never  Scientific laboratory technician  Use: Never used  Substance Use Topics   Alcohol use: No   Drug use: No    ROS: Constitutional:  Negative for fever, chills, weight loss CV: Negative for chest pain, previous MI, hypertension Respiratory:  Negative for shortness of breath, wheezing, sleep apnea, frequent cough GI:  Negative for nausea, vomiting, bloody stool, GERD  Physical exam: BP (!) 150/71   Pulse 90   Ht '5\' 7"'$  (1.702 m)   Wt 300 lb (136.1 kg)   BMI 46.99 kg/m  GENERAL APPEARANCE:  Well appearing, well developed, well nourished, NAD HEENT:  Atraumatic, normocephalic, oropharynx clear NECK:  Supple without lymphadenopathy or thyromegaly ABDOMEN:  Soft, non-tender, no masses EXTREMITIES:  Moves all extremities well, without clubbing, cyanosis, or edema NEUROLOGIC:  Alert and oriented x 3, normal gait, CN II-XII grossly intact MENTAL STATUS:  appropriate BACK:  Non-tender to palpation, No CVAT SKIN:  Warm, dry, and intact  Results: U/A: >30 WBCs, 0-2 RBCs, few bacteria, nitrite positive

## 2022-08-07 ENCOUNTER — Telehealth: Payer: Self-pay

## 2022-08-07 LAB — URINE CULTURE

## 2022-08-07 MED ORDER — CIPROFLOXACIN HCL 500 MG PO TABS: 500.0000 mg | ORAL_TABLET | Freq: Two times a day (BID) | ORAL | 0 refills | Status: DC

## 2022-08-07 NOTE — Telephone Encounter (Signed)
-----   Message from Primus Bravo, MD sent at 08/07/2022 12:16 PM EST ----- Please notify Mrs. Rondinelli to begin Cipro BID x 7 days for UTI treatment.   Rx sent.

## 2022-08-07 NOTE — Addendum Note (Signed)
Addended by: Primus Bravo on: 08/07/2022 12:16 PM   Modules accepted: Orders

## 2022-08-07 NOTE — Telephone Encounter (Signed)
Left voicemail for pt to return call for results.

## 2022-08-08 ENCOUNTER — Other Ambulatory Visit: Payer: Self-pay

## 2022-08-08 DIAGNOSIS — R8290 Unspecified abnormal findings in urine: Secondary | ICD-10-CM

## 2022-08-08 MED ORDER — CIPROFLOXACIN HCL 500 MG PO TABS: 500.0000 mg | ORAL_TABLET | Freq: Two times a day (BID) | ORAL | 0 refills | Status: AC

## 2022-08-08 NOTE — Progress Notes (Addendum)
Rx for cipro updated to preferred pharmacy, pt aware of MD response to urine culture.

## 2022-09-27 DIAGNOSIS — R7303 Prediabetes: Secondary | ICD-10-CM | POA: Diagnosis not present

## 2022-09-27 DIAGNOSIS — E039 Hypothyroidism, unspecified: Secondary | ICD-10-CM | POA: Diagnosis not present

## 2022-09-27 DIAGNOSIS — E559 Vitamin D deficiency, unspecified: Secondary | ICD-10-CM | POA: Diagnosis not present

## 2022-09-27 DIAGNOSIS — I10 Essential (primary) hypertension: Secondary | ICD-10-CM | POA: Diagnosis not present

## 2022-10-03 DIAGNOSIS — E039 Hypothyroidism, unspecified: Secondary | ICD-10-CM | POA: Diagnosis not present

## 2022-10-03 DIAGNOSIS — Z713 Dietary counseling and surveillance: Secondary | ICD-10-CM | POA: Diagnosis not present

## 2022-10-03 DIAGNOSIS — R161 Splenomegaly, not elsewhere classified: Secondary | ICD-10-CM | POA: Diagnosis not present

## 2022-10-03 DIAGNOSIS — K760 Fatty (change of) liver, not elsewhere classified: Secondary | ICD-10-CM | POA: Diagnosis not present

## 2022-10-03 DIAGNOSIS — R0602 Shortness of breath: Secondary | ICD-10-CM | POA: Diagnosis not present

## 2022-10-03 DIAGNOSIS — R310 Gross hematuria: Secondary | ICD-10-CM | POA: Diagnosis not present

## 2022-10-03 DIAGNOSIS — R7303 Prediabetes: Secondary | ICD-10-CM | POA: Diagnosis not present

## 2022-10-03 DIAGNOSIS — M25562 Pain in left knee: Secondary | ICD-10-CM | POA: Diagnosis not present

## 2022-10-03 DIAGNOSIS — I10 Essential (primary) hypertension: Secondary | ICD-10-CM | POA: Diagnosis not present

## 2022-10-03 DIAGNOSIS — R809 Proteinuria, unspecified: Secondary | ICD-10-CM | POA: Diagnosis not present

## 2022-10-03 DIAGNOSIS — E559 Vitamin D deficiency, unspecified: Secondary | ICD-10-CM | POA: Diagnosis not present

## 2022-10-19 ENCOUNTER — Ambulatory Visit (HOSPITAL_COMMUNITY)
Admission: RE | Admit: 2022-10-19 | Discharge: 2022-10-19 | Disposition: A | Discharge status: Home / Self Care | Payer: 59 | Source: Ambulatory Visit | Attending: Urology | Admitting: Urology

## 2022-10-19 DIAGNOSIS — M47816 Spondylosis without myelopathy or radiculopathy, lumbar region: Secondary | ICD-10-CM | POA: Diagnosis not present

## 2022-10-19 DIAGNOSIS — Z87442 Personal history of urinary calculi: Secondary | ICD-10-CM | POA: Diagnosis not present

## 2022-10-19 DIAGNOSIS — N200 Calculus of kidney: Secondary | ICD-10-CM | POA: Diagnosis not present

## 2022-10-23 ENCOUNTER — Ambulatory Visit (INDEPENDENT_AMBULATORY_CARE_PROVIDER_SITE_OTHER): Payer: 59 | Admitting: Urology

## 2022-10-23 VITALS — BP 168/73 | HR 71

## 2022-10-23 DIAGNOSIS — N200 Calculus of kidney: Secondary | ICD-10-CM | POA: Diagnosis not present

## 2022-10-23 LAB — URINALYSIS, ROUTINE W REFLEX MICROSCOPIC
Bilirubin, UA: NEGATIVE
Glucose, UA: NEGATIVE
Ketones, UA: NEGATIVE
Leukocytes,UA: NEGATIVE
Nitrite, UA: NEGATIVE
Protein,UA: NEGATIVE
RBC, UA: NEGATIVE
Specific Gravity, UA: 1.03 (ref 1.005–1.030)
Urobilinogen, Ur: 0.2 mg/dL (ref 0.2–1.0)
pH, UA: 5 (ref 5.0–7.5)

## 2022-10-23 NOTE — Progress Notes (Signed)
10/23/2022 9:20 AM   Alexa [name] [birth year] AT:[id number]  Referring provider: Celene Squibb, MD [address] [place],  [place] [address]  No chief complaint on file.   HPI:  F/u -   1) kidney stones - CT Feb 2023 - . She underwent Rt URS/HLL/stent in May 2023 with prestent for UTI. Stone analysis showed 70% calcium oxalate monohydrate and 30% calcium oxalate dihydrate. Her stent was removed on 02/09/22.   She had gross hematuria and recurrent UTI which resolved p the URS.   KUB from 04/25/2022 showed a 4 mm calcification in the right lower renal shadow. Renal ultrasound from 05/26/2022 showed a apparent small stone in both kidneys, no evidence of renal mass or obstruction.  Today, seen for the above. Feb 2024 KUB with a 3-5 mm right mid to lower pole renal stone. This was compared to images from 2023 KUB, [place] and CT. No flank or gross hematuria. No dysuria or gross hematuria.   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    PMH: Past Medical History:  Diagnosis Date   Arthritis    back, legs, knees   Cancer ([place]) 1984   uterine   History of kidney stones    Hypertension    Hypothyroidism    Thyroid disease     Surgical History: Past Surgical History:  Procedure Laterality Date   ABDOMINAL HYSTERECTOMY     CARPAL TUNNEL RELEASE Right    CHOLECYSTECTOMY     COLONOSCOPY N/A 09/23/2018   Procedure: COLONOSCOPY;  Surgeon: Rogene Houston, MD;  [facility]: [facility];  Service: Endoscopy;  Laterality: N/A;  10:30   CYSTOSCOPY WITH RETROGRADE PYELOGRAM, URETEROSCOPY AND STENT PLACEMENT Right 01/08/2022   Procedure: CYSTOSCOPY WITH RETROGRADE PYELOGRAM, URETEROSCOPY AND STENT PLACEMENT;  Surgeon: Ardis Hughs, MD;  Location: [facility];  Service: Urology;  Laterality: Right;   CYSTOSCOPY WITH RETROGRADE PYELOGRAM, URETEROSCOPY AND STENT PLACEMENT Right 02/01/2022   Procedure: CYSTOSCOPY WITH RETROGRADE PYELOGRAM, URETEROSCOPY AND STENT EXCHANGE;  Surgeon:  Primus Bravo., MD;  Location: [facility];  Service: Urology;  Laterality: Right;   HOLMIUM LASER APPLICATION Right [id number]   Procedure: HOLMIUM LASER APPLICATION;  Surgeon: Primus Bravo., MD;  Location: [facility];  Service: Urology;  Laterality: Right;   POLYPECTOMY  09/23/2018   Procedure: POLYPECTOMY;  Surgeon: Rogene Houston, MD;  [facility];  Service: Endoscopy;;  CS and BX   STONE EXTRACTION WITH BASKET Right 02/01/2022   Procedure: STONE EXTRACTION WITH BASKET;  Surgeon: Primus Bravo., MD;  Location: [facility];  Service: Urology;  Laterality: Right;   TONSILLECTOMY      Home Medications:  Allergies as of 10/23/2022       Reactions   Codeine Nausea Only        Medication List        Accurate as of October 23, 2022  9:20 AM. If you have any questions, ask your nurse or doctor.          amLODipine 10 MG tablet Commonly known as: NORVASC Take 1 tablet (10 mg total) by mouth daily.   famotidine 20 MG tablet Commonly known as: PEPCID Take 1 tablet (20 mg total) by mouth 2 (two) times daily.   hydrALAZINE 25 MG tablet Commonly known as: APRESOLINE Take 1 tablet twice a day by oral route as directed for 30 days.   levothyroxine 150 MCG tablet Commonly known as: SYNTHROID Take  150 mcg by mouth daily before breakfast.   losartan 100 MG tablet Commonly known as: COZAAR Take 1 tablet by mouth daily.   nystatin cream Commonly known as: MYCOSTATIN Apply to groin area twice daily as needed for rash, itching        Allergies:  Allergies  Allergen Reactions   Codeine Nausea Only    Family History: No family history on file.  Social History:  reports that she has quit smoking. She has never used smokeless tobacco. She reports that she does not drink alcohol and does not use drugs.   Physical Exam: BP (!) 168/73   Pulse 71   Constitutional:  Alert and oriented, No acute distress. HEENT: [place] AT, moist mucus membranes.  Trachea  midline, no masses. Cardiovascular: No clubbing, cyanosis, or edema. Respiratory: Normal respiratory effort, no increased work of breathing. GI: Abdomen is soft, nontender, nondistended, no abdominal masses GU: No CVA tenderness Skin: No rashes, bruises or suspicious lesions. Neurologic: Grossly intact, no focal deficits, moving all 4 extremities. Psychiatric: Normal mood and affect.  Laboratory Data: Lab Results  Component Value Date   WBC 7.4 07/30/2022   HGB 13.5 07/30/2022   HCT 41.5 07/30/2022   MCV 87.7 07/30/2022   PLT 161 07/30/2022    Lab Results  Component Value Date   CREATININE 0.90 07/30/2022    No results found for: "PSA"  No results found for: "TESTOSTERONE"  No results found for: "HGBA1C"  Urinalysis    Component Value Date/Time   COLORURINE YELLOW 01/07/2022 2111   APPEARANCEUR Cloudy (A) 08/03/2022 0918   LABSPEC 1.020 01/07/2022 2111   PHURINE 6.0 01/07/2022 2111   GLUCOSEU Negative 08/03/2022 0918   HGBUR LARGE (A) 01/07/2022 2111   BILIRUBINUR Negative 08/03/2022 0918   KETONESUR NEGATIVE 01/07/2022 2111   PROTEINUR 1+ (A) 08/03/2022 0918   PROTEINUR 100 (A) 01/07/2022 2111   UROBILINOGEN 0.2 04/28/2015 0750   NITRITE Positive (A) 08/03/2022 0918   NITRITE NEGATIVE 01/07/2022 2111   LEUKOCYTESUR 1+ (A) 08/03/2022 0918   LEUKOCYTESUR LARGE (A) 01/07/2022 2111    Lab Results  Component Value Date   LABMICR See below: 08/03/2022   WBCUA >30 (A) 08/03/2022   LABEPIT 0-10 08/03/2022   MUCUS Present 02/09/2022   BACTERIA Few 08/03/2022    Pertinent Imaging: [place], kub, ct - 2023, kub - 2024  Results for orders placed during the hospital encounter of 10/19/22  Abdomen 1 view (KUB)  Narrative CLINICAL DATA:  Nephrolithiasis, previous lithotripsy  EXAM: ABDOMEN - 1 VIEW  COMPARISON:  04/25/2022  FINDINGS: Supine frontal views of the abdomen and pelvis are obtained. There may be a 5 mm residual right renal calculus, versus material  within the fecal stream. No other radiopaque urinary tract calculi. No bowel obstruction or ileus. No abdominal masses. Stable degenerative changes lower lumbar spine.  IMPRESSION: 1. Possible residual 5 mm right renal calculus, versus material within the fecal stream. 2. Unremarkable bowel gas pattern.   Electronically Signed By: Randa Ngo M.D. On: 10/22/2022 19:44  No results found for this or any previous visit.  No results found for this or any previous visit.  No results found for this or any previous visit.  Results for orders placed during the hospital encounter of 05/26/22  US RENAL  Narrative CLINICAL DATA:  Nephrolithiasis follow-up.  EXAM: RENAL / URINARY TRACT ULTRASOUND COMPLETE  COMPARISON:  CT scan of the abdomen and pelvis without contrast October 28, 2021  FINDINGS: Right Kidney:  Renal  measurements: 10.3 x 5.0 x 5.9 cm = volume: 155 mL. There appears to be a subtle 3 mm calcification/stone in the right kidney without hydronephrosis.  Left Kidney:  Renal measurements: 12.0 x 5.0 x 6.0 cm = volume: 188.2 mL. There appears to be a 2.6 mm stone without obstruction in the left kidney.  Bladder:  Appears normal for degree of bladder distention.  Other:  None.  IMPRESSION: A single small stone is identified in each kidney. No hydronephrosis/obstruction. The bladder is unremarkable.   Electronically Signed By: Dorise Bullion III M.D. On: 05/26/2022 13:40  No valid procedures specified. Results for orders placed during the hospital encounter of 10/26/21  CT HEMATURIA WORKUP  Narrative CLINICAL DATA:  A 62 year old female with intermittent hematuria for 1.5 years.  EXAM: CT ABDOMEN AND PELVIS WITHOUT AND WITH CONTRAST  TECHNIQUE: Multidetector CT imaging of the abdomen and pelvis was performed following the standard protocol before and following the bolus administration of intravenous contrast.  RADIATION DOSE REDUCTION:  This exam was performed according to the departmental dose-optimization program which includes automated exposure control, adjustment of the mA and/or kV according to patient size and/or use of iterative reconstruction technique.  CONTRAST:  174m ISOVUE-300 IOPAMIDOL (ISOVUE-300) INJECTION 61%  COMPARISON:  Imaging from 2016.  FINDINGS: Lower chest: Incidental imaging of the lung bases is unremarkable, no consolidation or sign of pleural effusion.  Hepatobiliary: Fissural widening, mildly nodular hepatic contour. Post cholecystectomy without biliary duct distension.  Pancreas: Normal, without mass, inflammation or ductal dilatation.  Spleen: 15 cm greatest craniocaudal dimension, no focal lesion.  Adrenals/Urinary Tract: Adrenal glands are normal.  RIGHT kidney: Interpolar RIGHT kidney with a 13 x 8 mm calculus in the RIGHT renal pelvis. No signs of hydronephrosis. Peripelvic stranding. Additional calculus measuring 2-3 mm. No suspicious renal lesion. No ureteral calculi.  LEFT kidney: No signs of nephrolithiasis, hydronephrosis or ureteral calculi. No visible lesion. Urinary bladder with smooth contours.  Stomach/Bowel: No acute gastrointestinal findings. The appendix is normal. Colonic diverticulosis.  Vascular/Lymphatic:  Aortic atherosclerosis. No sign of aneurysm. Smooth contour of the IVC. There is no gastrohepatic or hepatoduodenal ligament lymphadenopathy. No retroperitoneal or mesenteric lymphadenopathy.  No pelvic sidewall lymphadenopathy.  Mild atherosclerotic changes throughout the abdominal aorta tracking in the iliac vessels.  Reproductive: Post hysterectomy.  No sign of adnexal mass.  Other: No ascites. No free air. No substantial abdominal wall hernia.  Musculoskeletal: No acute bone finding. No destructive bone process. Spinal degenerative changes. Marked central canal narrowing is noted at L3-4 and L4-5 due to facet hypertrophy and disc  degenerative changes. There is also grade 1 anterolisthesis of L3 on L4.  Excretory phase: No secondary signs of stricture. Mild diffuse urothelial thickening about the renal pelvis on the RIGHT surrounding the large calculus in the RIGHT renal pelvis. Urinary bladder with limited assessment, no gross lesion.  IMPRESSION: 1. 13 mm calculus in the RIGHT renal pelvis with associated stranding about the renal sinus. No signs of hydronephrosis. 2. Additional 2-3 mm calculus in the interpolar RIGHT kidney. 3. Colonic diverticulosis without evidence of acute diverticulitis. 4. Marked central canal narrowing at L3-4 and L4-5 due to facet hypertrophy and disc degenerative changes. There is also grade 1 anterolisthesis of L3 on L4. 5. Aortic atherosclerosis. 6. Signs that suggest cirrhosis and portal hypertension with nodular liver and splenomegaly.  Aortic Atherosclerosis (ICD10-I70.0).   Electronically Signed By: GZetta BillsM.D. On: 10/28/2021 10:00  Results for orders placed during the hospital encounter of 04/28/15  CT Renal Stone Study  Narrative CLINICAL DATA:  Right flank pain since last night  EXAM: CT ABDOMEN AND PELVIS WITHOUT CONTRAST  TECHNIQUE: Multidetector CT imaging of the abdomen and pelvis was performed following the standard protocol without IV contrast.  COMPARISON:  None.  FINDINGS: 3 mm right UPJ calculus is associated with mild right hydronephrosis. There is a tiny calculus in the right renal pelvis. The right renal pelvis and ureter are nondilated. No left renal calculi or left ureteral calculus.  Postcholecystectomy  Unenhanced liver, spleen, pancreas, adrenal glands are within normal limits.  Normal appendix.  Sigmoid diverticulosis without diverticulitis. Bladder is decompressed. Uterus is absent. Adnexa are unremarkable.  There is severe spinal stenosis at L4-5 likely due to facet arthropathy and malalignment. Severe degenerative disc  disease occurs at L5-S1.  IMPRESSION: 3 mm right UPJ calculus is associated with mild secondary findings of right ureteral obstruction.  L4-5 lumbar spinal stenosis.   Electronically Signed By: Marybelle Killings M.D. On: 04/28/2015 10:44   Assessment & Plan:    1. Nephrolithiasis Stable small right renal stone.   - Urinalysis, Routine w reflex microscopic   No follow-ups on file.  Festus Aloe, MD  [facility]  [address] [place],  [address] [phone]

## 2022-11-02 ENCOUNTER — Encounter: Payer: Self-pay | Admitting: Radiology

## 2022-11-21 ENCOUNTER — Encounter: Payer: Self-pay | Admitting: Orthopedic Surgery

## 2022-11-21 ENCOUNTER — Ambulatory Visit (INDEPENDENT_AMBULATORY_CARE_PROVIDER_SITE_OTHER): Payer: 59 | Admitting: Orthopedic Surgery

## 2022-11-21 DIAGNOSIS — M1711 Unilateral primary osteoarthritis, right knee: Secondary | ICD-10-CM

## 2022-11-21 DIAGNOSIS — M1712 Unilateral primary osteoarthritis, left knee: Secondary | ICD-10-CM | POA: Diagnosis not present

## 2022-11-21 NOTE — Progress Notes (Signed)
Orthopaedic Clinic Return  Assessment: Alexa [name] is a 62 y.o. female with the following: Severe bilateral knee arthritis  Plan: Mrs. Berendsen continues to have pain in both of her knees.  Injections continue to be effective.  She is requesting injections both knees.  These were completed today in clinic without issue.  Continue with Aleve as needed.  She will follow-up as needed.  Procedure note injection Right knee joint   Verbal consent was obtained to inject the right knee joint  Timeout was completed to confirm the site of injection.  The skin was prepped with alcohol and ethyl chloride was sprayed at the injection site.  A 21-gauge needle was used to inject 40 mg of Depo-Medrol and 1% lidocaine (3 cc) into the right knee using an anterolateral approach.  There were no complications. A sterile bandage was applied.   Procedure note injection Left knee joint   Verbal consent was obtained to inject the left knee joint  Timeout was completed to confirm the site of injection.  The skin was prepped with alcohol and ethyl chloride was sprayed at the injection site.  A 21-gauge needle was used to inject 40 mg of Depo-Medrol and 1% lidocaine (3 cc) into the left knee using an anterolateral approach.  There were no complications. A sterile bandage was applied.   Follow-up: Return if symptoms worsen or fail to improve.   Subjective:  Chief Complaint  Patient presents with   Injections    Bilat knee pain R>L     History of Present Illness: Alexa [name] is a 62 y.o. female who returns to clinic today for repeat evaluation of bilateral knee pain.  I last saw her in clinic approximately 4 months ago.  Injections of both knees were effective.  She continues to have pain.  Pain is progressively worsening.  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   Review of Systems: No fevers or chills No numbness or tingling No chest  pain No shortness of breath No bowel or bladder dysfunction No GI distress No headaches    Objective: There were no vitals taken for this visit.  Physical Exam:  Alert and oriented.  No acute distress.  Obese female.  Slow, waddling gait.  No assistive device  Evaluation of bilateral knees demonstrates varus overall alignment.  Tenderness to palpation on the medial joint lines.  Range of motion restricted from 5 to 110 degrees.  No obvious laxity to varus or valgus stress.  She is able to maintain a straight leg raise.  Sensation is intact distally.  No lesions.  Diffuse redness distal bilateral lower extremities.  IMAGING: I personally ordered and reviewed the following images:  No new imaging obtained today.  Mordecai Rasmussen, MD 11/21/2022 9:24 AM

## 2022-11-21 NOTE — Patient Instructions (Addendum)
Instructions Following Joint Injections  In clinic today, you received an injection in one of your joints (sometimes more than one).  Occasionally, you can have some pain at the injection site, this is normal.  You can place ice at the injection site, or take over-the-counter medications such as Tylenol (acetaminophen) or Advil (ibuprofen).  Please follow all directions listed on the bottle.  If your joint (knee or shoulder) becomes swollen, red or very painful, please contact the clinic for additional assistance.   Two medications were injected, including lidocaine and a steroid (often referred to as cortisone).  Lidocaine is effective almost immediately but wears off quickly.  However, the steroid can take a few days to improve your symptoms.  In some cases, it can make your pain worse for a couple of days.  Do not be concerned if this happens as it is common.  You can apply ice or take some over-the-counter medications as needed.     Must wait 3 months between injections

## 2023-03-05 ENCOUNTER — Ambulatory Visit (INDEPENDENT_AMBULATORY_CARE_PROVIDER_SITE_OTHER): Payer: 59 | Admitting: Orthopedic Surgery

## 2023-03-05 DIAGNOSIS — M1712 Unilateral primary osteoarthritis, left knee: Secondary | ICD-10-CM

## 2023-03-05 DIAGNOSIS — Z6841 Body Mass Index (BMI) 40.0 and over, adult: Secondary | ICD-10-CM

## 2023-03-05 DIAGNOSIS — M1711 Unilateral primary osteoarthritis, right knee: Secondary | ICD-10-CM | POA: Diagnosis not present

## 2023-03-05 MED ORDER — METHYLPREDNISOLONE ACETATE 40 MG/ML IJ SUSP
40.0000 mg | Freq: Once | INTRAMUSCULAR | Status: AC
  Administered 2023-03-05: 40 mg via INTRA_ARTICULAR

## 2023-03-05 NOTE — Progress Notes (Signed)
Chief Complaint  Patient presents with   Knee Pain    Inj bil knee    Encounter Diagnoses  Name Primary?   Arthritis of left knee Yes   Arthritis of right knee    Severe obesity (BMI >= 40) (HCC)    BMI 50.0-59.9, adult (HCC)     Procedure note for bilateral knee injections  Procedure note left knee injection verbal consent was obtained to inject left knee joint  Timeout was completed to confirm the site of injection  The medications used were 40 mg depomedrol and 3 cc of 1% lidocaine  Anesthesia was provided by ethyl chloride and the skin was prepped with alcohol.  After cleaning the skin with alcohol a 20-gauge needle was used to inject the left knee joint. There were no complications. A sterile bandage was applied.   Procedure note right knee injection verbal consent was obtained to inject right knee joint  Timeout was completed to confirm the site of injection  The medications used were 40 mg depomedrol and 3 cc of 1% lidocaine  Anesthesia was provided by ethyl chloride and the skin was prepped with alcohol.  After cleaning the skin with alcohol a 20-gauge needle was used to inject the right knee joint. There were no complications. A sterile bandage was applied.

## 2023-04-11 DIAGNOSIS — R7303 Prediabetes: Secondary | ICD-10-CM | POA: Diagnosis not present

## 2023-04-11 DIAGNOSIS — E559 Vitamin D deficiency, unspecified: Secondary | ICD-10-CM | POA: Diagnosis not present

## 2023-04-11 DIAGNOSIS — I10 Essential (primary) hypertension: Secondary | ICD-10-CM | POA: Diagnosis not present

## 2023-04-11 DIAGNOSIS — E039 Hypothyroidism, unspecified: Secondary | ICD-10-CM | POA: Diagnosis not present

## 2023-04-17 DIAGNOSIS — R161 Splenomegaly, not elsewhere classified: Secondary | ICD-10-CM | POA: Diagnosis not present

## 2023-04-17 DIAGNOSIS — E559 Vitamin D deficiency, unspecified: Secondary | ICD-10-CM | POA: Diagnosis not present

## 2023-04-17 DIAGNOSIS — M25562 Pain in left knee: Secondary | ICD-10-CM | POA: Diagnosis not present

## 2023-04-17 DIAGNOSIS — I10 Essential (primary) hypertension: Secondary | ICD-10-CM | POA: Diagnosis not present

## 2023-04-17 DIAGNOSIS — Z0000 Encounter for general adult medical examination without abnormal findings: Secondary | ICD-10-CM | POA: Diagnosis not present

## 2023-04-17 DIAGNOSIS — K760 Fatty (change of) liver, not elsewhere classified: Secondary | ICD-10-CM | POA: Diagnosis not present

## 2023-04-17 DIAGNOSIS — R800 Isolated proteinuria: Secondary | ICD-10-CM | POA: Diagnosis not present

## 2023-04-17 DIAGNOSIS — Z6841 Body Mass Index (BMI) 40.0 and over, adult: Secondary | ICD-10-CM | POA: Diagnosis not present

## 2023-04-17 DIAGNOSIS — K219 Gastro-esophageal reflux disease without esophagitis: Secondary | ICD-10-CM | POA: Diagnosis not present

## 2023-04-17 DIAGNOSIS — E039 Hypothyroidism, unspecified: Secondary | ICD-10-CM | POA: Diagnosis not present

## 2023-04-17 DIAGNOSIS — Z0001 Encounter for general adult medical examination with abnormal findings: Secondary | ICD-10-CM | POA: Diagnosis not present

## 2023-04-24 DIAGNOSIS — I10 Essential (primary) hypertension: Secondary | ICD-10-CM | POA: Diagnosis not present

## 2023-04-24 DIAGNOSIS — Z79899 Other long term (current) drug therapy: Secondary | ICD-10-CM | POA: Diagnosis not present

## 2023-06-11 ENCOUNTER — Telehealth: Payer: Self-pay | Admitting: Orthopedic Surgery

## 2023-06-11 NOTE — Telephone Encounter (Signed)
Returned the patients call, lvm for her to call me back.  She wants to schedule bil knee injections.

## 2023-06-18 ENCOUNTER — Encounter: Payer: Self-pay | Admitting: Orthopedic Surgery

## 2023-06-18 ENCOUNTER — Ambulatory Visit: Payer: 59 | Admitting: Orthopedic Surgery

## 2023-06-18 DIAGNOSIS — Z6841 Body Mass Index (BMI) 40.0 and over, adult: Secondary | ICD-10-CM

## 2023-06-18 DIAGNOSIS — M1711 Unilateral primary osteoarthritis, right knee: Secondary | ICD-10-CM

## 2023-06-18 DIAGNOSIS — M170 Bilateral primary osteoarthritis of knee: Secondary | ICD-10-CM | POA: Diagnosis not present

## 2023-06-18 DIAGNOSIS — M1712 Unilateral primary osteoarthritis, left knee: Secondary | ICD-10-CM

## 2023-06-18 MED ORDER — METHYLPREDNISOLONE ACETATE 40 MG/ML IJ SUSP
40.0000 mg | Freq: Once | INTRAMUSCULAR | Status: AC
  Administered 2023-06-18: 40 mg via INTRA_ARTICULAR

## 2023-06-18 NOTE — Progress Notes (Signed)
Chief Complaint  Patient presents with   Injections    Bilateral knees    Encounter Diagnoses  Name Primary?   Severe obesity (BMI >= 40) (HCC) Yes   BMI 50.0-59.9, adult (HCC)    Arthritis of left knee    Arthritis of right knee    Procedure note for bilateral knee injections  Procedure note left knee injection verbal consent was obtained to inject left knee joint  Timeout was completed to confirm the site of injection  The medications used were 40 mg depomedrol and 3 cc of 1% lidocaine  Anesthesia was provided by ethyl chloride and the skin was prepped with alcohol.  After cleaning the skin with alcohol a 20-gauge needle was used to inject the left knee joint. There were no complications. A sterile bandage was applied.   Procedure note right knee injection verbal consent was obtained to inject right knee joint  Timeout was completed to confirm the site of injection  The medications used were 40 mg depomedrol and 3 cc of 1% lidocaine  Anesthesia was provided by ethyl chloride and the skin was prepped with alcohol.  After cleaning the skin with alcohol a 20-gauge needle was used to inject the right knee joint. There were no complications. A sterile bandage was applied.

## 2023-09-19 NOTE — Progress Notes (Signed)
   There were no vitals taken for this visit.  There is no height or weight on file to calculate BMI.  Chief Complaint  Patient presents with   Injections    Both knees     No diagnosis found.  DOI/DOS/ Date: N/A  Unchanged

## 2023-09-20 ENCOUNTER — Ambulatory Visit: Payer: Medicare Other | Admitting: Orthopedic Surgery

## 2023-09-20 ENCOUNTER — Encounter: Payer: Self-pay | Admitting: Orthopedic Surgery

## 2023-09-20 VITALS — Ht 67.0 in | Wt 300.0 lb

## 2023-09-20 DIAGNOSIS — M170 Bilateral primary osteoarthritis of knee: Secondary | ICD-10-CM

## 2023-09-20 DIAGNOSIS — M1711 Unilateral primary osteoarthritis, right knee: Secondary | ICD-10-CM

## 2023-09-20 DIAGNOSIS — M1712 Unilateral primary osteoarthritis, left knee: Secondary | ICD-10-CM

## 2023-09-20 MED ORDER — METHYLPREDNISOLONE ACETATE 40 MG/ML IJ SUSP
40.0000 mg | Freq: Once | INTRAMUSCULAR | Status: AC
  Administered 2023-09-20: 40 mg via INTRA_ARTICULAR

## 2023-09-20 NOTE — Progress Notes (Signed)
Patient ID: Alexa [name], female   DOB: [birth year], 63 y.o.   MRN: [id number]  Chief Complaint  Patient presents with   Injections    Both knees     Encounter Diagnoses  Name Primary?   Arthritis of left knee Yes   Arthritis of right knee      DOI/DOS/ Date: N/A  Unchanged  Procedure note for bilateral knee injections  Procedure note left knee injection verbal consent was obtained to inject left knee joint  Timeout was completed to confirm the site of injection  The medications used were 40 mg depomedrol and 3 cc of 1% lidocaine  Anesthesia was provided by ethyl chloride and the skin was prepped with alcohol.  After cleaning the skin with alcohol a 20-gauge needle was used to inject the left knee joint. There were no complications. A sterile bandage was applied.   Procedure note right knee injection verbal consent was obtained to inject right knee joint  Timeout was completed to confirm the site of injection  The medications used were 40 mg depomedrol and 3 cc of 1% lidocaine  Anesthesia was provided by ethyl chloride and the skin was prepped with alcohol.  After cleaning the skin with alcohol a 20-gauge needle was used to inject the right knee joint. There were no complications. A sterile bandage was applied.   Consider HA INJX   Needs xrays nxt time

## 2023-09-20 NOTE — Patient Instructions (Signed)

## 2023-10-02 ENCOUNTER — Telehealth: Payer: Self-pay

## 2023-10-02 NOTE — Telephone Encounter (Signed)
VOB submitted for euflexxa, bilateral knee.

## 2023-10-03 ENCOUNTER — Telehealth: Payer: Self-pay

## 2023-10-03 NOTE — Telephone Encounter (Signed)
VOB submitted for durolane, bilateral knee due to Euflexxa not being a preferred product.

## 2023-10-16 ENCOUNTER — Telehealth: Payer: Self-pay

## 2023-10-16 NOTE — Telephone Encounter (Signed)
Please schedule for gel injection with Dr. Romeo Apple for a one time injection.  All gel information has been added under referrals tab.

## 2023-10-17 NOTE — Telephone Encounter (Signed)
Noted and Yes. Thank you

## 2023-10-18 ENCOUNTER — Telehealth: Payer: Self-pay | Admitting: Orthopedic Surgery

## 2023-10-18 NOTE — Telephone Encounter (Signed)
Dr. Mort Sawyers pt - pt has been approved for the Durolane gel injection, $50 copay.  The patient stated that she had injections 09/20/23 and wants to know if she needs to wait three months to get the gel injections, is there a waiting period from when she had the injections?  [phone]

## 2023-10-19 ENCOUNTER — Encounter (INDEPENDENT_AMBULATORY_CARE_PROVIDER_SITE_OTHER): Payer: Self-pay | Admitting: *Deleted

## 2023-10-19 NOTE — Telephone Encounter (Signed)
Please proceed with instructions per Toniann Fail

## 2023-10-22 DIAGNOSIS — R609 Edema, unspecified: Secondary | ICD-10-CM | POA: Insufficient documentation

## 2023-10-22 DIAGNOSIS — M545 Low back pain, unspecified: Secondary | ICD-10-CM | POA: Insufficient documentation

## 2023-10-22 NOTE — Telephone Encounter (Addendum)
I called patient to clarify if she is asking if she has to wait to have the durolane she has not had it yet. If it is approved she can schedule, right? Why do I need to call insurance if Alexa [name] has already gotten approval. So confused right now.   Left message for her to call back and schedule the Durolane injections  Or speak to me if she wants something else.

## 2023-10-22 NOTE — Telephone Encounter (Signed)
Ok thanks I called her to clarify she only needs the injections on Friday she has voiced understanding

## 2023-10-22 NOTE — Telephone Encounter (Signed)
Alexa [name] wants me to call her and tell her to also schedule a steroid injection after she has the Durolane  I called her again. For injections Durolane  I made the appointment for Friday  Told her I will call her back and let her know when she needs the steroid injections after the Durolane, this must be something new I am not aware of but would find out and let her know.

## 2023-10-22 NOTE — Telephone Encounter (Signed)
Thanks,I did leave message for her to call back to schedule Durolane

## 2023-10-26 ENCOUNTER — Ambulatory Visit: Payer: Medicare Other | Admitting: Orthopedic Surgery

## 2023-10-26 DIAGNOSIS — M170 Bilateral primary osteoarthritis of knee: Secondary | ICD-10-CM

## 2023-10-26 DIAGNOSIS — Z6841 Body Mass Index (BMI) 40.0 and over, adult: Secondary | ICD-10-CM

## 2023-10-26 DIAGNOSIS — M1711 Unilateral primary osteoarthritis, right knee: Secondary | ICD-10-CM

## 2023-10-26 MED ORDER — SODIUM HYALURONATE 60 MG/3ML IX PRSY
60.0000 mg | PREFILLED_SYRINGE | Freq: Once | INTRA_ARTICULAR | Status: AC
  Administered 2023-10-26: 60 mg via INTRA_ARTICULAR

## 2023-10-29 NOTE — Progress Notes (Signed)
 Chief Complaint  Patient presents with   Injections    Bilateral Durolane    Procedure note for injection of hyaluronic acid   Diagnosis osteoarthritis of the knee  Verbal consent was obtained to inject the knee with HYALURONIC ACID . Timeout was completed to confirm the injection site as the right    Knee  Ethyl chloride spray was used for anesthesia Alcohol was used to prep the skin. The infrapatellar lateral portal was used as an injection site and 1 vial of hyaluronic acid  was injected into the knee  Specific Co. Preparation: durolane   No complications were noted   Procedure note for injection of hyaluronic acid   Diagnosis osteoarthritis of the knee  Verbal consent was obtained to inject the knee with HYALURONIC ACID . Timeout was completed to confirm the injection site as the left    Knee  Ethyl chloride spray was used for anesthesia Alcohol was used to prep the skin. The infrapatellar lateral portal was used as an injection site and 1 vial of hyaluronic acid  was injected into the knee  Specific Co. Preparation: durolane   No complications were noted

## 2023-11-19 IMAGING — CT CT ABD-PEL WO/W CM
4 of 6 series · 12 of 32 positions shown, 17 images · IV contrast (APPLIED)
Comparison: Imaging from [id number].

CLINICAL DATA: A 61-year-old female with intermittent hematuria for
1.5 years.

EXAM:
CT ABDOMEN AND PELVIS WITHOUT AND WITH CONTRAST
TECHNIQUE: Multidetector CT imaging of the abdomen and pelvis was performed
following the standard protocol before and following the bolus
administration of intravenous contrast.

[Series 2: abd/pelvis w/(date) · axial · 0.98mm/px · z∈[-426,-176]mm · 3 of 100 slices shown]
[im 25/100  soft-tissue]
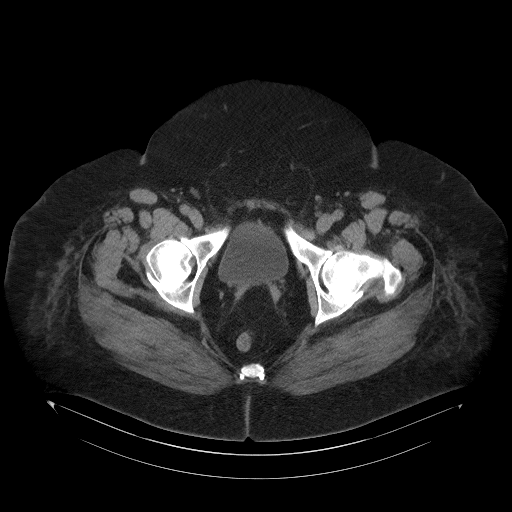
[im 50/100  soft-tissue]
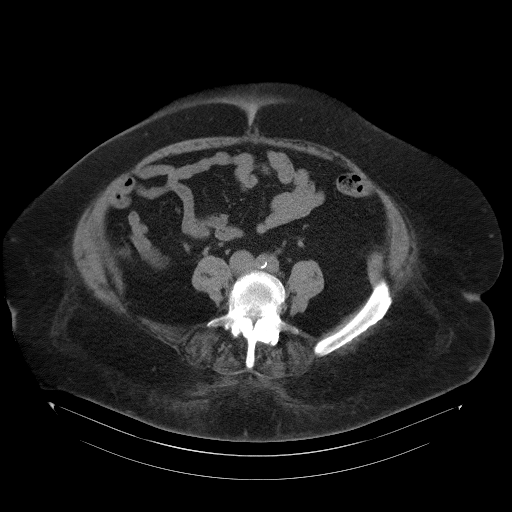
[im 75/100  soft-tissue]
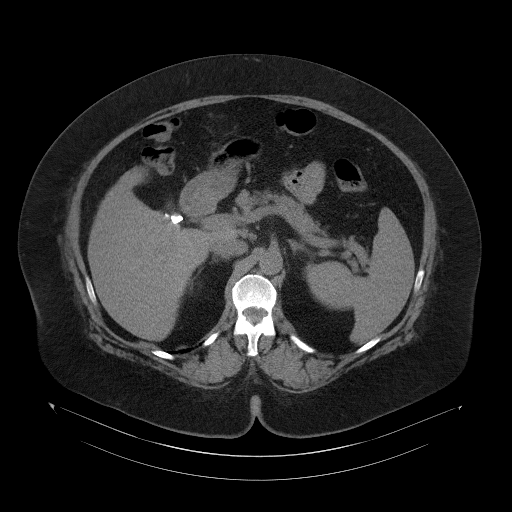

[Series 6: abd/pelvis w cont · axial · 0.98mm/px · z∈[-450,-150]mm · 4 of 100 slices shown, 9 images]
[im 20/100  soft-tissue]
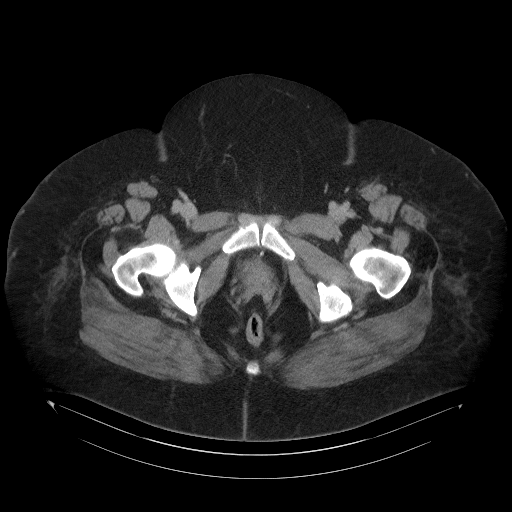
[im 20/100  lung]
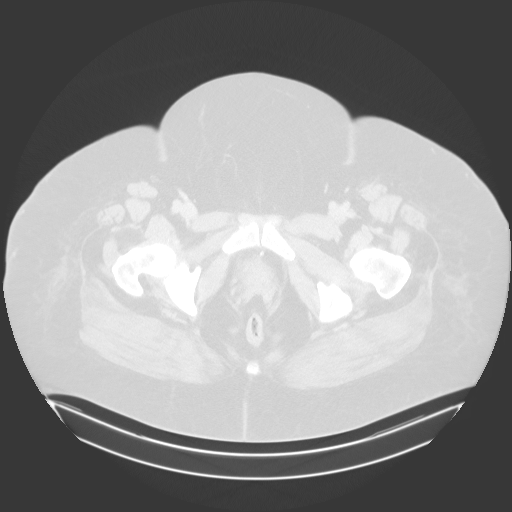
[im 20/100  bone]
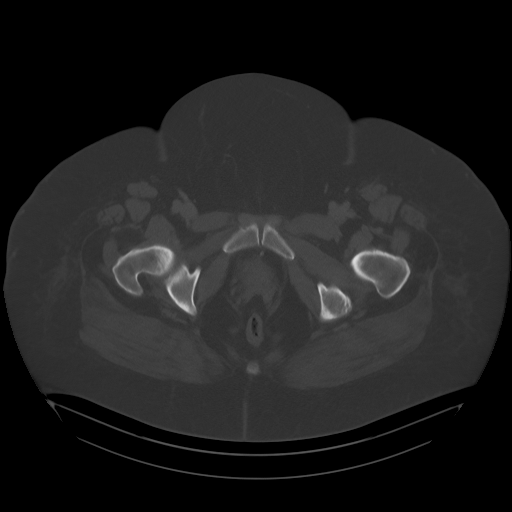
[im 40/100  soft-tissue]
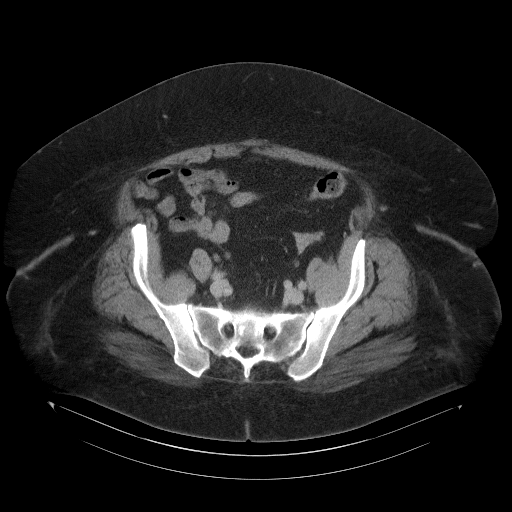
[im 40/100  lung]
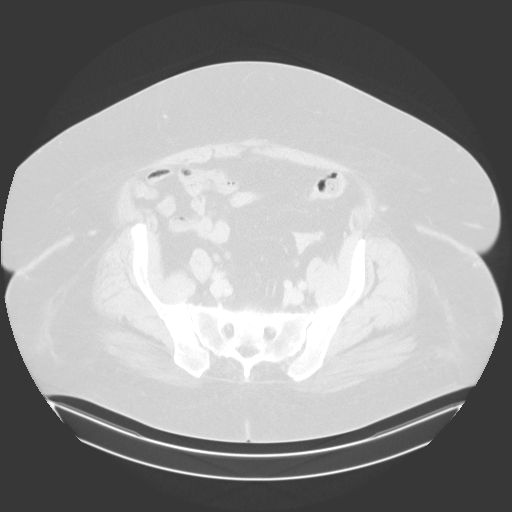
[im 60/100  soft-tissue]
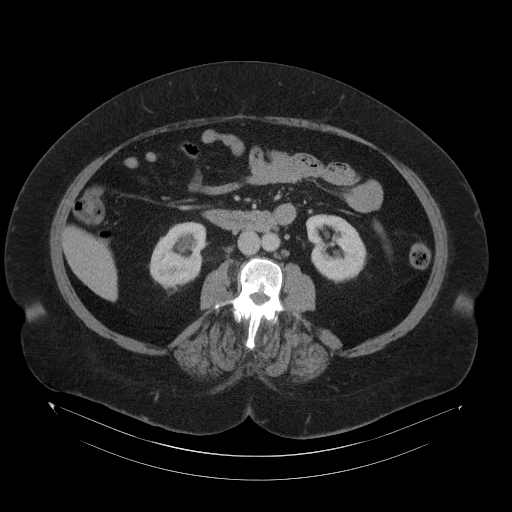
[im 60/100  lung]
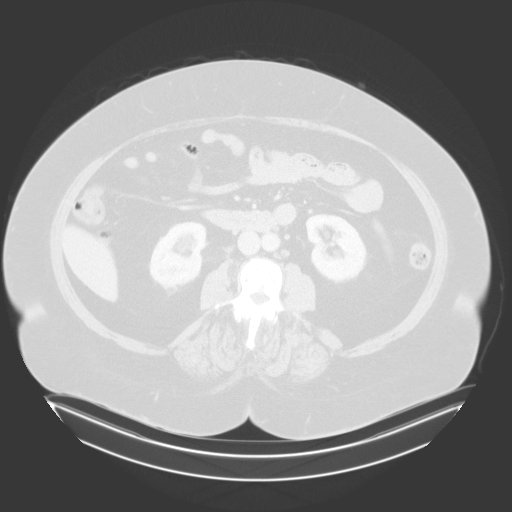
[im 80/100  soft-tissue]
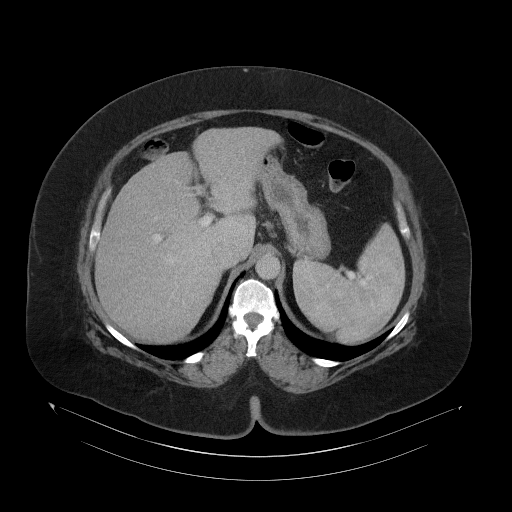
[im 80/100  lung]
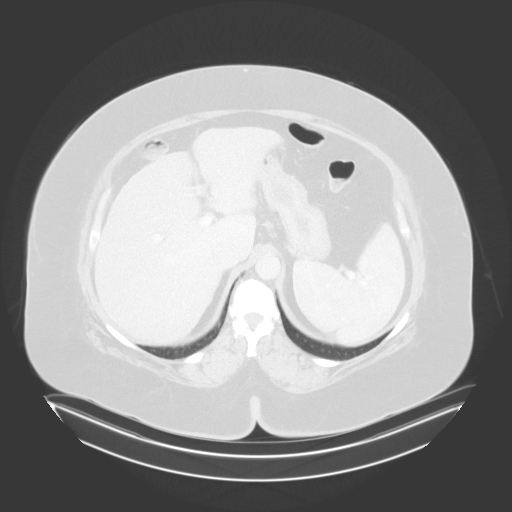

[Series 11: abd/pelvis w/cont · axial · 0.94mm/px · z∈[-311,-11]mm · 4 of 100 slices shown]
[im 20/100  soft-tissue]
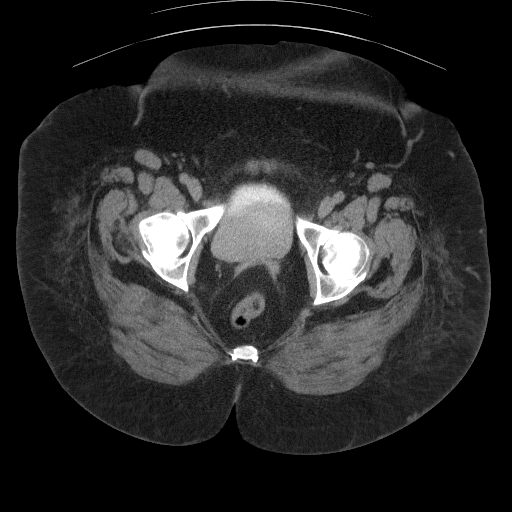
[im 40/100  soft-tissue]
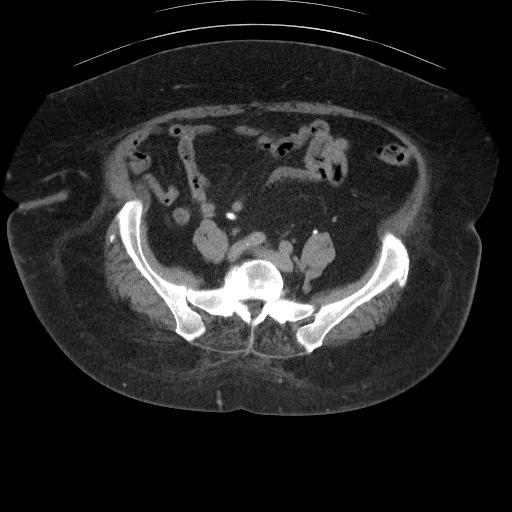
[im 60/100  soft-tissue]
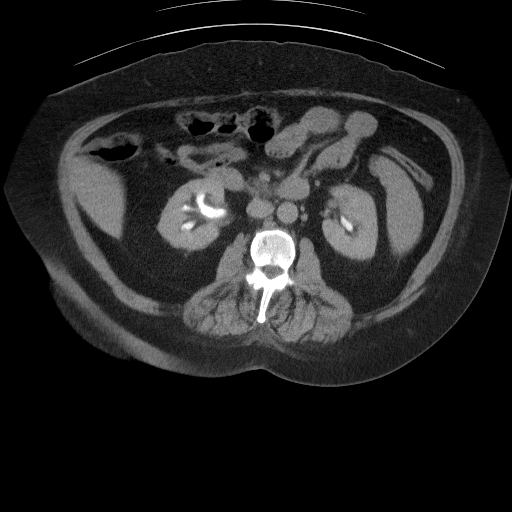
[im 80/100  soft-tissue]
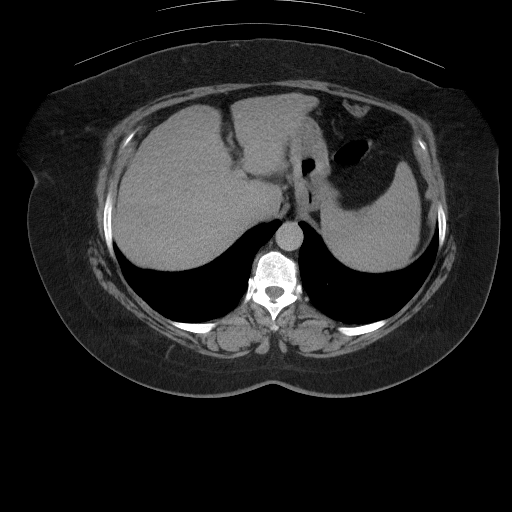

[Series 14: lung prone delay · axial · delayed · 0.94mm/px · 1 of 97 slices shown]
[im 20/97  bone]
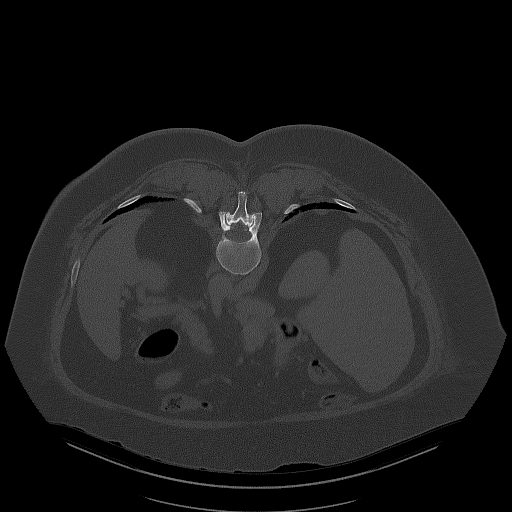

[12 of 32 positions shown; findings below may reference images not displayed]

RADIATION DOSE REDUCTION: This exam was performed according to the
departmental dose-optimization program which includes automated
exposure control, adjustment of the mA and/or kV according to
patient size and/or use of iterative reconstruction technique.

CONTRAST:  100mL XYRFLL-AGG IOPAMIDOL (XYRFLL-AGG) INJECTION 61%
FINDINGS: Lower chest: Incidental imaging of the lung bases is unremarkable,
no consolidation or sign of pleural effusion.

Hepatobiliary: Fissural widening, mildly nodular hepatic contour.
Post cholecystectomy without biliary duct distension.

Pancreas: Normal, without mass, inflammation or ductal dilatation.

Spleen: 15 cm greatest craniocaudal dimension, no focal lesion.

Adrenals/Urinary Tract: Adrenal glands are normal.

RIGHT kidney: Interpolar RIGHT kidney with a 13 x 8 mm calculus in
the RIGHT renal pelvis. No signs of hydronephrosis. Peripelvic
stranding. Additional calculus measuring 2-3 mm. No suspicious renal
lesion. No ureteral calculi.

LEFT kidney: No signs of nephrolithiasis, hydronephrosis or ureteral
calculi. No visible lesion. Urinary bladder with smooth contours.

Stomach/Bowel: No acute gastrointestinal findings. The appendix is
normal. Colonic diverticulosis.

Vascular/Lymphatic:

Aortic atherosclerosis. No sign of aneurysm. Smooth contour of the
IVC. There is no gastrohepatic or hepatoduodenal ligament
lymphadenopathy. No retroperitoneal or mesenteric lymphadenopathy.

No pelvic sidewall lymphadenopathy.

Mild atherosclerotic changes throughout the abdominal aorta tracking
in the iliac vessels.

Reproductive: Post hysterectomy.  No sign of adnexal mass.

Other: No ascites. No free air. No substantial abdominal wall
hernia.

Musculoskeletal: No acute bone finding. No destructive bone process.
Spinal degenerative changes. Marked central canal narrowing is noted
at L3-4 and L4-5 due to facet hypertrophy and disc degenerative
changes. There is also grade 1 anterolisthesis of L3 on L4.

Excretory phase: No secondary signs of stricture. Mild diffuse
urothelial thickening about the renal pelvis on the RIGHT
surrounding the large calculus in the RIGHT renal pelvis. Urinary
bladder with limited assessment, no gross lesion.
IMPRESSION: 1. 13 mm calculus in the RIGHT renal pelvis with associated
stranding about the renal sinus. No signs of hydronephrosis.
2. Additional 2-3 mm calculus in the interpolar RIGHT kidney.
3. Colonic diverticulosis without evidence of acute diverticulitis.
4. Marked central canal narrowing at L3-4 and L4-5 due to facet
hypertrophy and disc degenerative changes. There is also grade 1
anterolisthesis of L3 on L4.
5. Aortic atherosclerosis.
6. Signs that suggest cirrhosis and portal hypertension with nodular
liver and splenomegaly.

Aortic Atherosclerosis ([id number]).

## 2023-11-23 ENCOUNTER — Other Ambulatory Visit: Payer: Self-pay | Admitting: Orthopedic Surgery

## 2023-11-23 ENCOUNTER — Telehealth: Payer: Self-pay | Admitting: Orthopedic Surgery

## 2023-11-23 MED ORDER — PREDNISONE 10 MG PO TABS: 10.0000 mg | ORAL_TABLET | Freq: Three times a day (TID) | ORAL | 0 refills | Status: DC

## 2023-11-23 MED ORDER — TRAMADOL-ACETAMINOPHEN 37.5-325 MG PO TABS: 1.0000 | ORAL_TABLET | ORAL | 5 refills | Status: DC | PRN

## 2023-11-23 NOTE — Progress Notes (Signed)
 Meds ordered this encounter  Medications   traMADol-acetaminophen (ULTRACET) 37.5-325 MG tablet    Sig: Take 1 tablet by mouth every 4 (four) hours as needed.    Dispense:  90 tablet    Refill:  5   predniSONE (DELTASONE) 10 MG tablet    Sig: Take 1 tablet (10 mg total) by mouth 3 (three) times daily.    Dispense:  42 tablet    Refill:  0    For   Knee pain s/p injx

## 2023-11-23 NOTE — Telephone Encounter (Signed)
 Resend Tramadol to new pharmacy [facility] I have resent the prednisone

## 2023-11-23 NOTE — Telephone Encounter (Signed)
 DR. Romeo Apple  Patient called this morning left voicemail stating she got gel injections about 4 weeks ago and her knee is still swelling really bad and it is painful.  If you can call me back at [phone]

## 2023-12-31 ENCOUNTER — Telehealth: Payer: Self-pay | Admitting: Orthopedic Surgery

## 2023-12-31 NOTE — Telephone Encounter (Signed)
 Dr. Delfino Fellers pt - spoke w/the pt, she had a gel injection on 10/26/23, but still in a lot of pain.  She wants to know if she can have cortisone shots.  Also, she stated that the prednisone  Dr. Marvina Slough prescribed helped some, she stated the pain meds weren't helping so she stopped those, she doesn't want to take them.  She also is wanting to know what she can do for the pain, if there is any exercises she can do.  [phone]

## 2024-01-25 DIAGNOSIS — I10 Essential (primary) hypertension: Secondary | ICD-10-CM | POA: Diagnosis not present

## 2024-01-25 DIAGNOSIS — E039 Hypothyroidism, unspecified: Secondary | ICD-10-CM | POA: Diagnosis not present

## 2024-01-31 DIAGNOSIS — I10 Essential (primary) hypertension: Secondary | ICD-10-CM | POA: Diagnosis not present

## 2024-01-31 DIAGNOSIS — R310 Gross hematuria: Secondary | ICD-10-CM | POA: Diagnosis not present

## 2024-01-31 DIAGNOSIS — R800 Isolated proteinuria: Secondary | ICD-10-CM | POA: Diagnosis not present

## 2024-01-31 DIAGNOSIS — R7303 Prediabetes: Secondary | ICD-10-CM | POA: Diagnosis not present

## 2024-01-31 DIAGNOSIS — Z87442 Personal history of urinary calculi: Secondary | ICD-10-CM | POA: Diagnosis not present

## 2024-01-31 DIAGNOSIS — E039 Hypothyroidism, unspecified: Secondary | ICD-10-CM | POA: Diagnosis not present

## 2024-01-31 DIAGNOSIS — D696 Thrombocytopenia, unspecified: Secondary | ICD-10-CM | POA: Diagnosis not present

## 2024-01-31 DIAGNOSIS — K219 Gastro-esophageal reflux disease without esophagitis: Secondary | ICD-10-CM | POA: Diagnosis not present

## 2024-01-31 DIAGNOSIS — K760 Fatty (change of) liver, not elsewhere classified: Secondary | ICD-10-CM | POA: Diagnosis not present

## 2024-01-31 DIAGNOSIS — R161 Splenomegaly, not elsewhere classified: Secondary | ICD-10-CM | POA: Diagnosis not present

## 2024-01-31 DIAGNOSIS — K625 Hemorrhage of anus and rectum: Secondary | ICD-10-CM | POA: Diagnosis not present

## 2024-02-01 IMAGING — DX DG ABDOMEN 1V
2 series · 2 of 2 positions shown · non-contrast
Comparison: None Available.

CLINICAL DATA: Nephrolithiasis.

EXAM:
ABDOMEN - 1 VIEW

[abdomen kub (1 of 2)]
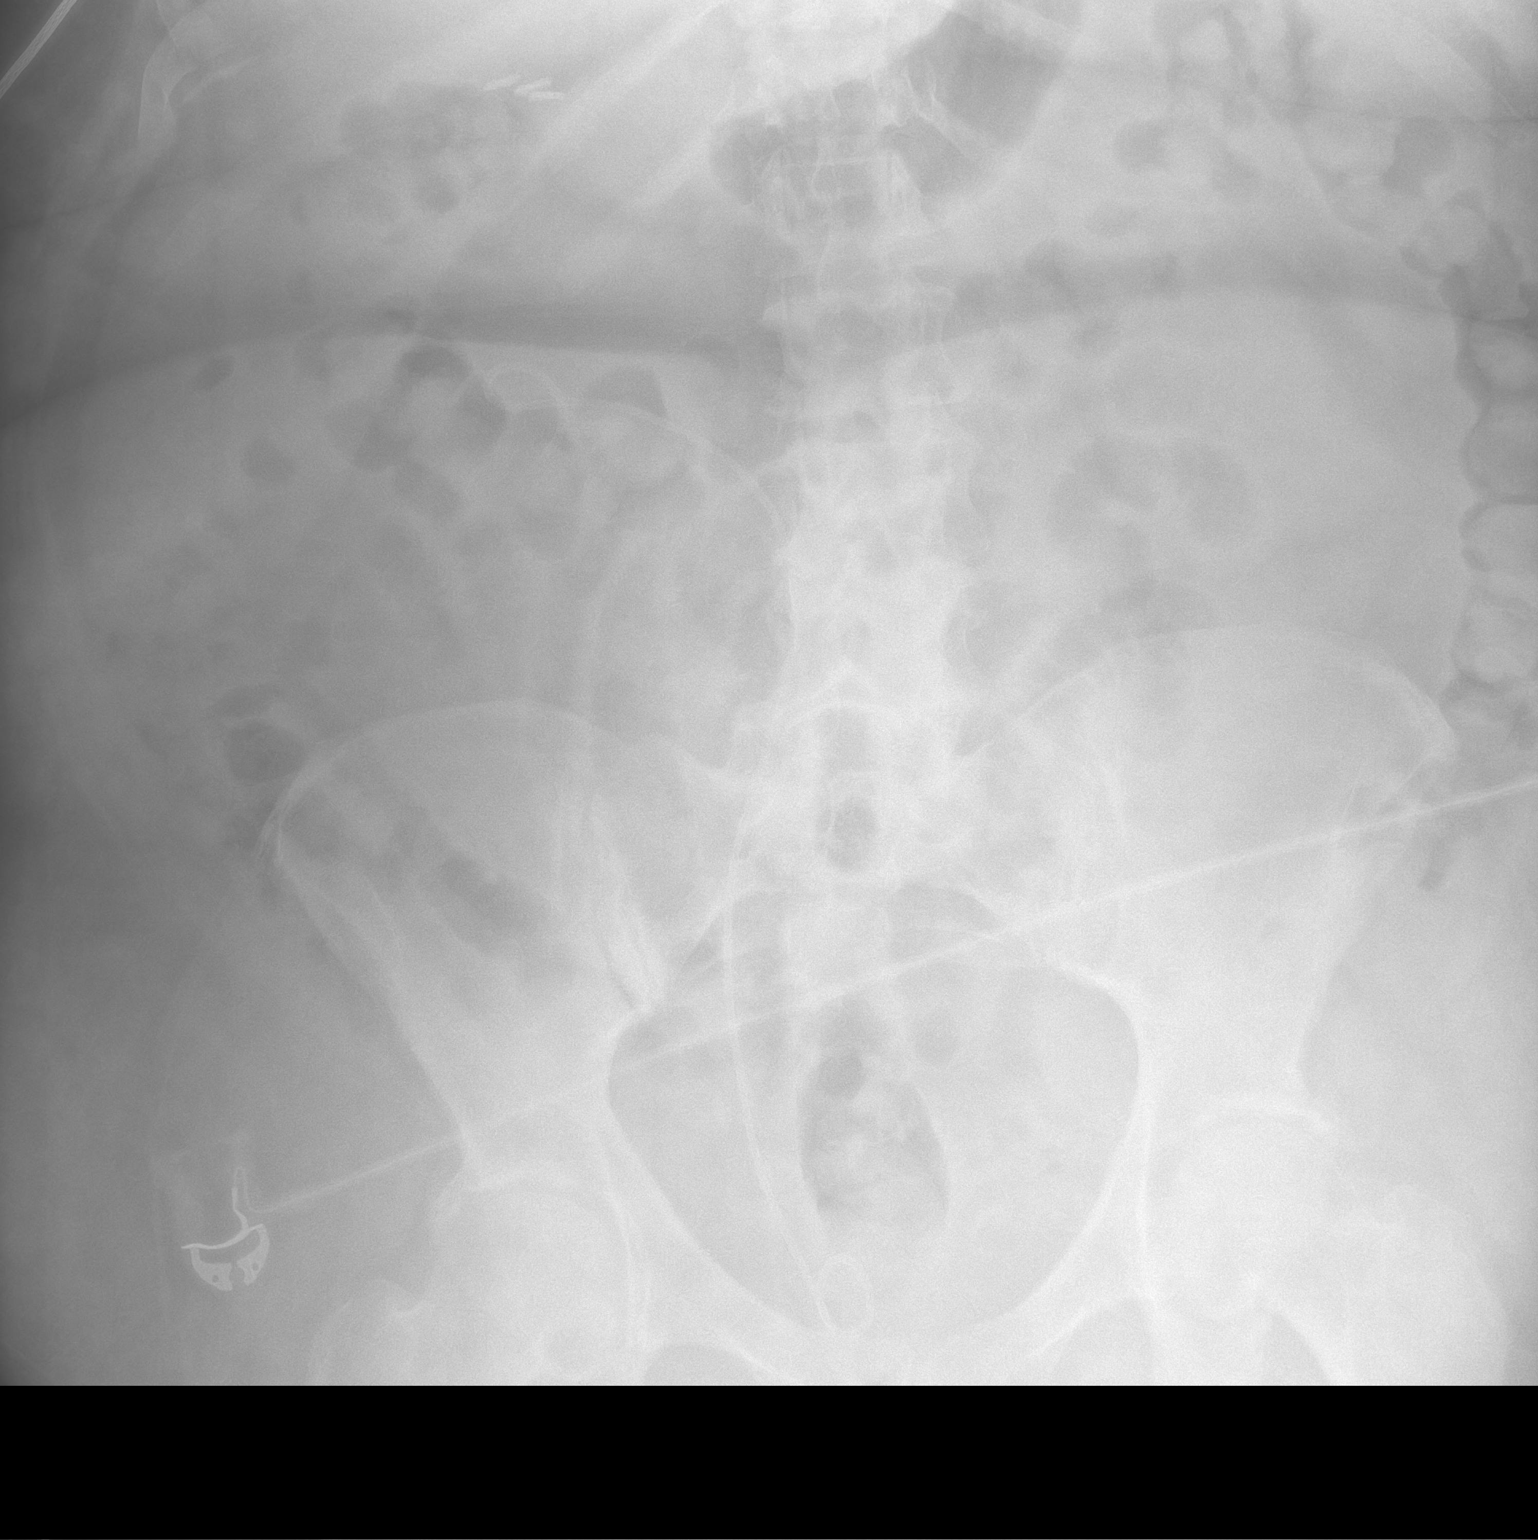

[abdomen kub (2 of 2)]
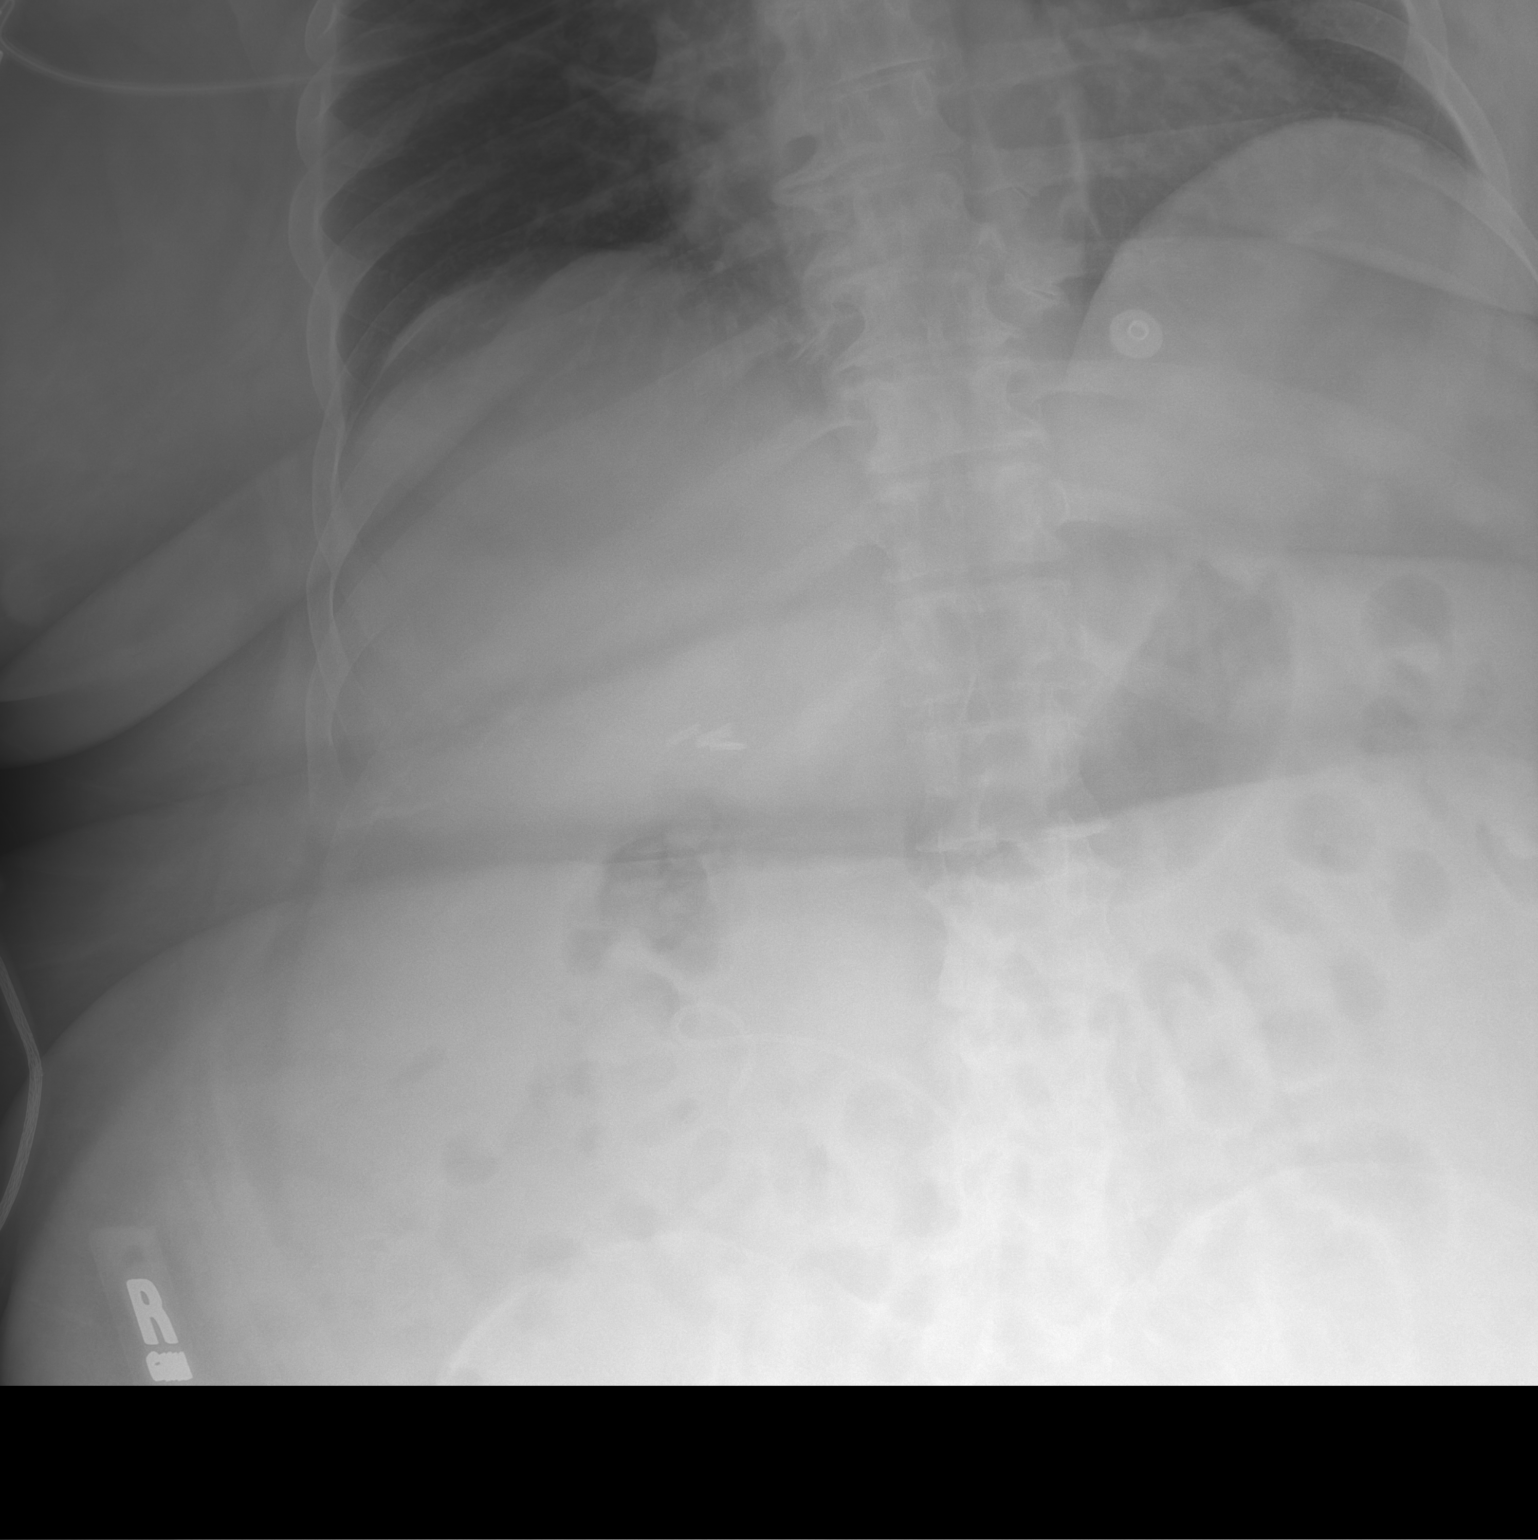

[2 of 2 positions shown; findings below may reference images not displayed]

FINDINGS: Technically limited by large patient habitus. Double pigtail right
ureteral stent is seen in place. No large radiopaque calculi
identified, although sensitivity limited by large patient habitus.
Right upper quadrant surgical clips are seen from prior
cholecystectomy. The bowel gas pattern is normal.
IMPRESSION: Technically limited by large habitus. Right ureteral stent in place.
No acute findings.

## 2024-02-05 ENCOUNTER — Encounter (INDEPENDENT_AMBULATORY_CARE_PROVIDER_SITE_OTHER): Payer: Self-pay | Admitting: *Deleted

## 2024-02-11 ENCOUNTER — Encounter: Payer: Self-pay | Admitting: *Deleted

## 2024-02-11 ENCOUNTER — Telehealth: Payer: Self-pay | Admitting: *Deleted

## 2024-02-11 ENCOUNTER — Encounter (INDEPENDENT_AMBULATORY_CARE_PROVIDER_SITE_OTHER): Payer: Self-pay | Admitting: Gastroenterology

## 2024-02-11 ENCOUNTER — Ambulatory Visit (INDEPENDENT_AMBULATORY_CARE_PROVIDER_SITE_OTHER): Admitting: Gastroenterology

## 2024-02-11 VITALS — BP 148/76 | HR 80 | Temp 97.8°F | Ht 67.0 in | Wt 320.6 lb

## 2024-02-11 DIAGNOSIS — Z860101 Personal history of adenomatous and serrated colon polyps: Secondary | ICD-10-CM

## 2024-02-11 DIAGNOSIS — Z860100 Personal history of colon polyps, unspecified: Secondary | ICD-10-CM | POA: Insufficient documentation

## 2024-02-11 DIAGNOSIS — K625 Hemorrhage of anus and rectum: Secondary | ICD-10-CM | POA: Diagnosis not present

## 2024-02-11 NOTE — Progress Notes (Signed)
 Referring Provider: Omie Bickers, MD Primary Care Physician:  Omie Bickers, MD Primary GI Physician: new (Dr. Sammi Crick)  Chief Complaint  Patient presents with   Rectal Bleeding    Pt arrives due to one episode of rectal bleeding, possibly from hemorrhoid. Pt has TCS in 5 years. No bleeding since first episode.    HPI:   Alexa [name] is a 63 y.o. female with past medical history of uterine cancer, arthritis, HTN, hypothyroidism   Patient presenting today as a new patient for: Rectal bleeding and to get colonoscopy scheduled due to previous colon polyps   States one episode of rectal bleeding about 2 weeks ago after straining. Denies any abdominal pain, rectal pain.   Denies any other episodes of bleeding. She states since having her child she has had erratic BMs for years, since 1984. She notes sometimes loose BMs  but usually goes to the restroom daily. She states generally does not have to strain but had eaten some peanut butter crackers prior to feeling constipated and seeing blood.   Patient denies melena, nausea, vomiting, diarrhea, dysphagia, odyonophagia, early satiety or weight loss.    NSAID use: previously taking aleve but now on celebrex so no NSAIDs.  Social hx: no etoh or tobacco  Fam hx: no CRC, liver disease or pancreatic cancer   Last Colonoscopy:09/2018- One small polyp at the hepatic flexure. Biopsied.                           - Two small polyps in the sigmoid colon and at the                            splenic flexure, removed with a cold snare.                            Resected and retrieved.                           - One small polyp at the splenic flexure. Biopsied.                           - Diverticulosis in the sigmoid colon. TA/SSRP   Recommendations:  Repeat Colonoscopy January 2025  Filed Weights   02/11/24 0914  Weight: (!) 320 lb 9.6 oz (145.4 kg)     Past Medical History:  Diagnosis Date   Arthritis    back, legs, knees   Cancer  (HCC) 1984   uterine   History of kidney stones    Hypertension    Hypothyroidism    Thyroid  disease     Past Surgical History:  Procedure Laterality Date   ABDOMINAL HYSTERECTOMY     CARPAL TUNNEL RELEASE Right    CHOLECYSTECTOMY     COLONOSCOPY N/A 09/23/2018   Procedure: COLONOSCOPY;  Surgeon: Ruby Corporal, MD;  [facility];  Service: Endoscopy;  Laterality: N/A;  10:30   CYSTOSCOPY WITH RETROGRADE PYELOGRAM, URETEROSCOPY AND STENT PLACEMENT Right 01/08/2022   Procedure: CYSTOSCOPY WITH RETROGRADE PYELOGRAM, URETEROSCOPY AND STENT PLACEMENT;  Surgeon: Andrez Banker, MD;  Location: [facility];  Service: Urology;  Laterality: Right;   CYSTOSCOPY WITH RETROGRADE PYELOGRAM, URETEROSCOPY AND STENT PLACEMENT Right 02/01/2022   Procedure: CYSTOSCOPY WITH RETROGRADE PYELOGRAM, URETEROSCOPY AND STENT EXCHANGE;  Surgeon: Mellie Sprinkle., MD;  Location: [facility];  Service: Urology;  Laterality: Right;   HOLMIUM LASER APPLICATION Right 02/01/2022   Procedure: HOLMIUM LASER APPLICATION;  Surgeon: Mellie Sprinkle., MD;  Location: [facility];  Service: Urology;  Laterality: Right;   POLYPECTOMY  09/23/2018   Procedure: POLYPECTOMY;  Surgeon: Ruby Corporal, MD;  [facility];  Service: Endoscopy;;  CS and BX   STONE EXTRACTION WITH BASKET Right 02/01/2022   Procedure: STONE EXTRACTION WITH BASKET;  Surgeon: Mellie Sprinkle., MD;  Location: [facility];  Service: Urology;  Laterality: Right;   TONSILLECTOMY      Current Outpatient Medications  Medication Sig Dispense Refill   celecoxib (CELEBREX) 100 MG capsule Take 1 capsule every day by oral route for 30 days.     diclofenac Sodium (VOLTAREN) 1 % GEL APPLY 2 GRAMS TO THE AFFECTED AREA(S) BY TOPICAL ROUTE 4 TIMES PER DAY     famotidine  (PEPCID ) 20 MG tablet Take 1 tablet (20 mg total) by mouth 2 (two) times daily. 60 tablet 0   furosemide (LASIX) 20 MG tablet Take 20 mg by mouth daily.     hydrALAZINE (APRESOLINE)  25 MG tablet Take 1 tablet twice a day by oral route as directed for 30 days.     levothyroxine  (SYNTHROID ) 150 MCG tablet Take 150 mcg by mouth daily before breakfast.     losartan (COZAAR) 100 MG tablet Take 1 tablet by mouth daily.     nystatin  cream (MYCOSTATIN ) Apply to groin area twice daily as needed for rash, itching 30 g 0   No current facility-administered medications for this visit.    Allergies as of 02/11/2024 - Review Complete 02/11/2024  Allergen Reaction Noted   Codeine Nausea Only 04/28/2015    Social History   Socioeconomic History   Marital status: Married    Spouse name: Not on file   Number of children: Not on file   Years of education: Not on file   Highest education level: Not on file  Occupational History   Not on file  Tobacco Use   Smoking status: Former   Smokeless tobacco: Never  Vaping Use   Vaping status: Never Used  Substance and Sexual Activity   Alcohol use: No   Drug use: No   Sexual activity: Not Currently  Other Topics Concern   Not on file  Social History Narrative   Not on file   Social Drivers of Health   Financial Resource Strain: Not on file  Food Insecurity: Not on file  Transportation Needs: Not on file  Physical Activity: Not on file  Stress: Not on file  Social Connections: Not on file    Review of systems General: negative for malaise, night sweats, fever, chills, weight loss Neck: Negative for lumps, goiter, pain and significant neck swelling Resp: Negative for cough, wheezing, dyspnea at rest CV: Negative for chest pain, leg swelling, palpitations, orthopnea GI: denies melena, nausea, vomiting, dysphagia, odyonophagia, early satiety or unintentional weight loss. +1 episode of hematochezia +occasional looser stools/constipation  MSK: Negative for joint pain or swelling, back pain, and muscle pain. Derm: Negative for itching or rash Psych: Denies depression, anxiety, memory loss, confusion. No homicidal or suicidal  ideation.  Heme: Negative for prolonged bleeding, bruising easily, and swollen nodes. Endocrine: Negative for cold or heat intolerance, polyuria, polydipsia and goiter. Neuro: negative for tremor, gait imbalance, syncope and seizures. The remainder of the review of systems is noncontributory.  Physical Exam:  BP (!) 148/76   Pulse 80   Temp 97.8 F (36.6 C)   Ht 5' 7 (1.702 m)   Wt (!) 320 lb 9.6 oz (145.4 kg)   BMI 50.21 kg/m  General:   Alert and oriented. No distress noted. Pleasant and cooperative.  Head:  Normocephalic and atraumatic. Eyes:  Conjuctiva clear without scleral icterus. Mouth:  Oral mucosa pink and moist. Good dentition. No lesions. Heart: Normal rate and rhythm, s1 and s2 heart sounds present.  Lungs: Clear lung sounds in all lobes. Respirations equal and unlabored. Abdomen:  +BS, soft, non-tender and non-distended. No rebound or guarding. No HSM or masses noted. Derm: No palmar erythema or jaundice Msk:  Symmetrical without gross deformities. Normal posture. Extremities:  Without edema. Neurologic:  Alert and  oriented x4 Psych:  Alert and cooperative. Normal mood and affect.  Invalid input(s): 6 MONTHS   ASSESSMENT: Alexa [name] is a 63 y.o. female presenting today for rectal bleeding and to schedule colonoscopy due to previous colon polyps  Last TCS in 2020 with a tubular adenoma and sessile serrated polyp. Recommended to have repeat TCS in January 2025 which she was lost to follow up for. Reports 1 episode of rectal bleeding about 2 weeks ago after episode of constipation and straining. Denies any further episodes or associated symptoms. Bleeding likely related to mucosal tear or hemorrhoids in setting of straining, however, given history of polyps and recommended follow up as well as new onset of rectal bleeding, colonoscopy is recommended. Indications, risks and benefits of procedure discussed in detail with patient. Patient verbalized understanding and  is in agreement to proceed with Colonoscopy.     PLAN:  -schedule Colonoscopy ASA III -Increase water  intake, aim for atleast 64 oz per day -Increase fruits, veggies and whole grains, kiwi and prunes are especially good for constipation  All questions were answered, patient verbalized understanding and is in agreement with plan as outlined above.   Follow Up: TBD after Colonoscopy   Christel Bai L. Adrien Alberta, MSN, APRN, AGNP-C Adult-Gerontology Nurse Practitioner [facility]  I have reviewed the note and agree with the APP's assessment as described in this progress note  Samantha Cress, MD Gastroenterology and Hepatology [facility]

## 2024-02-11 NOTE — H&P (View-Only) (Signed)
 Referring Provider: Omie Bickers, MD Primary Care Physician:  Omie Bickers, MD Primary GI Physician: new (Dr. Sammi Crick)  Chief Complaint  Patient presents with   Rectal Bleeding    Pt arrives due to one episode of rectal bleeding, possibly from hemorrhoid. Pt has TCS in 5 years. No bleeding since first episode.    HPI:   Alexa [name] is a 63 y.o. female with past medical history of uterine cancer, arthritis, HTN, hypothyroidism   Patient presenting today as a new patient for: Rectal bleeding and to get colonoscopy scheduled due to previous colon polyps   States one episode of rectal bleeding about 2 weeks ago after straining. Denies any abdominal pain, rectal pain.   Denies any other episodes of bleeding. She states since having her child she has had erratic BMs for years, since 1984. She notes sometimes loose BMs  but usually goes to the restroom daily. She states generally does not have to strain but had eaten some peanut butter crackers prior to feeling constipated and seeing blood.   Patient denies melena, nausea, vomiting, diarrhea, dysphagia, odyonophagia, early satiety or weight loss.    NSAID use: previously taking aleve but now on celebrex so no NSAIDs.  Social hx: no etoh or tobacco  Fam hx: no CRC, liver disease or pancreatic cancer   Last Colonoscopy:09/2018- One small polyp at the hepatic flexure. Biopsied.                           - Two small polyps in the sigmoid colon and at the                            splenic flexure, removed with a cold snare.                            Resected and retrieved.                           - One small polyp at the splenic flexure. Biopsied.                           - Diverticulosis in the sigmoid colon. TA/SSRP   Recommendations:  Repeat Colonoscopy January 2025  Filed Weights   02/11/24 0914  Weight: (!) 320 lb 9.6 oz (145.4 kg)     Past Medical History:  Diagnosis Date   Arthritis    back, legs, knees   Cancer  (HCC) 1984   uterine   History of kidney stones    Hypertension    Hypothyroidism    Thyroid  disease     Past Surgical History:  Procedure Laterality Date   ABDOMINAL HYSTERECTOMY     CARPAL TUNNEL RELEASE Right    CHOLECYSTECTOMY     COLONOSCOPY N/A 09/23/2018   Procedure: COLONOSCOPY;  Surgeon: Ruby Corporal, MD;  [facility];  Service: Endoscopy;  Laterality: N/A;  10:30   CYSTOSCOPY WITH RETROGRADE PYELOGRAM, URETEROSCOPY AND STENT PLACEMENT Right 01/08/2022   Procedure: CYSTOSCOPY WITH RETROGRADE PYELOGRAM, URETEROSCOPY AND STENT PLACEMENT;  Surgeon: Andrez Banker, MD;  Location: [facility];  Service: Urology;  Laterality: Right;   CYSTOSCOPY WITH RETROGRADE PYELOGRAM, URETEROSCOPY AND STENT PLACEMENT Right 02/01/2022   Procedure: CYSTOSCOPY WITH RETROGRADE PYELOGRAM, URETEROSCOPY AND STENT EXCHANGE;  Surgeon: Mellie Sprinkle., MD;  Location: [facility];  Service: Urology;  Laterality: Right;   HOLMIUM LASER APPLICATION Right 02/01/2022   Procedure: HOLMIUM LASER APPLICATION;  Surgeon: Mellie Sprinkle., MD;  Location: [facility];  Service: Urology;  Laterality: Right;   POLYPECTOMY  09/23/2018   Procedure: POLYPECTOMY;  Surgeon: Ruby Corporal, MD;  [facility];  Service: Endoscopy;;  CS and BX   STONE EXTRACTION WITH BASKET Right 02/01/2022   Procedure: STONE EXTRACTION WITH BASKET;  Surgeon: Mellie Sprinkle., MD;  Location: [facility];  Service: Urology;  Laterality: Right;   TONSILLECTOMY      Current Outpatient Medications  Medication Sig Dispense Refill   celecoxib (CELEBREX) 100 MG capsule Take 1 capsule every day by oral route for 30 days.     diclofenac Sodium (VOLTAREN) 1 % GEL APPLY 2 GRAMS TO THE AFFECTED AREA(S) BY TOPICAL ROUTE 4 TIMES PER DAY     famotidine  (PEPCID ) 20 MG tablet Take 1 tablet (20 mg total) by mouth 2 (two) times daily. 60 tablet 0   furosemide (LASIX) 20 MG tablet Take 20 mg by mouth daily.     hydrALAZINE (APRESOLINE)  25 MG tablet Take 1 tablet twice a day by oral route as directed for 30 days.     levothyroxine  (SYNTHROID ) 150 MCG tablet Take 150 mcg by mouth daily before breakfast.     losartan (COZAAR) 100 MG tablet Take 1 tablet by mouth daily.     nystatin  cream (MYCOSTATIN ) Apply to groin area twice daily as needed for rash, itching 30 g 0   No current facility-administered medications for this visit.    Allergies as of 02/11/2024 - Review Complete 02/11/2024  Allergen Reaction Noted   Codeine Nausea Only 04/28/2015    Social History   Socioeconomic History   Marital status: Married    Spouse name: Not on file   Number of children: Not on file   Years of education: Not on file   Highest education level: Not on file  Occupational History   Not on file  Tobacco Use   Smoking status: Former   Smokeless tobacco: Never  Vaping Use   Vaping status: Never Used  Substance and Sexual Activity   Alcohol use: No   Drug use: No   Sexual activity: Not Currently  Other Topics Concern   Not on file  Social History Narrative   Not on file   Social Drivers of Health   Financial Resource Strain: Not on file  Food Insecurity: Not on file  Transportation Needs: Not on file  Physical Activity: Not on file  Stress: Not on file  Social Connections: Not on file    Review of systems General: negative for malaise, night sweats, fever, chills, weight loss Neck: Negative for lumps, goiter, pain and significant neck swelling Resp: Negative for cough, wheezing, dyspnea at rest CV: Negative for chest pain, leg swelling, palpitations, orthopnea GI: denies melena, nausea, vomiting, dysphagia, odyonophagia, early satiety or unintentional weight loss. +1 episode of hematochezia +occasional looser stools/constipation  MSK: Negative for joint pain or swelling, back pain, and muscle pain. Derm: Negative for itching or rash Psych: Denies depression, anxiety, memory loss, confusion. No homicidal or suicidal  ideation.  Heme: Negative for prolonged bleeding, bruising easily, and swollen nodes. Endocrine: Negative for cold or heat intolerance, polyuria, polydipsia and goiter. Neuro: negative for tremor, gait imbalance, syncope and seizures. The remainder of the review of systems is noncontributory.  Physical Exam:  BP (!) 148/76   Pulse 80   Temp 97.8 F (36.6 C)   Ht 5' 7 (1.702 m)   Wt (!) 320 lb 9.6 oz (145.4 kg)   BMI 50.21 kg/m  General:   Alert and oriented. No distress noted. Pleasant and cooperative.  Head:  Normocephalic and atraumatic. Eyes:  Conjuctiva clear without scleral icterus. Mouth:  Oral mucosa pink and moist. Good dentition. No lesions. Heart: Normal rate and rhythm, s1 and s2 heart sounds present.  Lungs: Clear lung sounds in all lobes. Respirations equal and unlabored. Abdomen:  +BS, soft, non-tender and non-distended. No rebound or guarding. No HSM or masses noted. Derm: No palmar erythema or jaundice Msk:  Symmetrical without gross deformities. Normal posture. Extremities:  Without edema. Neurologic:  Alert and  oriented x4 Psych:  Alert and cooperative. Normal mood and affect.  Invalid input(s): 6 MONTHS   ASSESSMENT: Alexa [name] is a 63 y.o. female presenting today for rectal bleeding and to schedule colonoscopy due to previous colon polyps  Last TCS in 2020 with a tubular adenoma and sessile serrated polyp. Recommended to have repeat TCS in January 2025 which she was lost to follow up for. Reports 1 episode of rectal bleeding about 2 weeks ago after episode of constipation and straining. Denies any further episodes or associated symptoms. Bleeding likely related to mucosal tear or hemorrhoids in setting of straining, however, given history of polyps and recommended follow up as well as new onset of rectal bleeding, colonoscopy is recommended. Indications, risks and benefits of procedure discussed in detail with patient. Patient verbalized understanding and  is in agreement to proceed with Colonoscopy.     PLAN:  -schedule Colonoscopy ASA III -Increase water  intake, aim for atleast 64 oz per day -Increase fruits, veggies and whole grains, kiwi and prunes are especially good for constipation  All questions were answered, patient verbalized understanding and is in agreement with plan as outlined above.   Follow Up: TBD after Colonoscopy   Christel Bai L. Adrien Alberta, MSN, APRN, AGNP-C Adult-Gerontology Nurse Practitioner [facility]  I have reviewed the note and agree with the APP's assessment as described in this progress note  Samantha Cress, MD Gastroenterology and Hepatology [facility]astroenter[facility]

## 2024-02-11 NOTE — Patient Instructions (Signed)
 We will get you scheduled for colonoscopy Increase water  intake, aim for atleast 64 oz per day Increase fruits, veggies and whole grains, kiwi and prunes are especially good for constipation  Follow up will be determined after colonoscopy  It was a pleasure to see you today. I want to create trusting relationships with patients and provide genuine, compassionate, and quality care. I truly value your feedback! please be on the lookout for a survey regarding your visit with me today. I appreciate your input about our visit and your time in completing this!    Alexa [name] L. Alexa Flanery, MSN, APRN, AGNP-C Adult-Gerontology Nurse Practitioner [facility] Gastroenterology at [facility]

## 2024-02-11 NOTE — Telephone Encounter (Signed)
 LMOVM to call back to give pre-op  appt details.

## 2024-02-14 DIAGNOSIS — M25561 Pain in right knee: Secondary | ICD-10-CM | POA: Diagnosis not present

## 2024-02-14 DIAGNOSIS — M25562 Pain in left knee: Secondary | ICD-10-CM | POA: Diagnosis not present

## 2024-02-21 DIAGNOSIS — R600 Localized edema: Secondary | ICD-10-CM | POA: Diagnosis not present

## 2024-02-21 DIAGNOSIS — I10 Essential (primary) hypertension: Secondary | ICD-10-CM | POA: Diagnosis not present

## 2024-02-28 NOTE — Patient Instructions (Signed)
 Alexa [name]  02/28/2024     @PREFPERIOPPHARMACY @   Your procedure is scheduled on Tuesday, July 1.  Report to [facility] at 0600 A.M.  Call this number if you have problems the morning of surgery:  [phone]  If you experience any cold or flu symptoms such as cough, fever, chills, shortness of breath, etc. between now and your scheduled surgery, please notify us  at the above number.   Remember:  Do not eat or drink after midnight.  You may drink clear liquids until 0330 .  Clear liquids allowed are:                    Water , Juice (No red color; non-citric and without pulp; diabetics please choose diet or no sugar options), Carbonated beverages (diabetics please choose diet or no sugar options), Clear Tea (No creamer, milk, or cream, including half & half and powdered creamer), and Black Coffee Only (No creamer, milk or cream, including half & half and powdered creamer)    Take these medicines the morning of surgery with A SIP OF WATER  pepcid , levothyroxine     Do not wear jewelry, make-up or nail polish, including gel polish,  artificial nails, or any other type of covering on natural nails (fingers and  toes).  Do not wear lotions, powders, or perfumes, or deodorant.  Do not shave 48 hours prior to surgery.  Men may shave face and neck.  Do not bring valuables to the hospital.  [facility] is not responsible for any belongings or valuables.  Contacts, dentures or bridgework may not be worn into surgery.  Leave your suitcase in the car.  After surgery it may be brought to your room.  For patients admitted to the hospital, discharge time will be determined by your treatment team.  Patients discharged the day of surgery will not be allowed to drive home.   Name and phone number of your driver:   driver Special instructions:  Please follow diet and prep instructions given to you by Dr Samuel office.  Please read over the following fact sheets that you were  given. Coughing and Deep Breathing, Anesthesia Post-op Instructions, and Care and Recovery After Surgery      Colonoscopy, Adult A colonoscopy is a procedure to look at the entire large intestine. This procedure is done using a long, thin, flexible tube that has a camera on the end. You may have a colonoscopy: As a part of normal colorectal screening. If you have certain symptoms, such as: A low number of red blood cells in your blood (anemia). Diarrhea that does not go away. Pain in your abdomen. Blood in your stool. A colonoscopy can help screen for and diagnose medical problems, including: An abnormal growth of cells or tissue (tumor). Abnormal growths within the lining of your intestine (polyps). Inflammation. Areas of bleeding. Tell your health care provider about: Any allergies you have. All medicines you are taking, including vitamins, herbs, eye drops, creams, and over-the-counter medicines. Any problems you or family members have had with anesthetic medicines. Any bleeding problems you have. Any surgeries you have had. Any medical conditions you have. Any problems you have had with having bowel movements. Whether you are pregnant or may be pregnant. What are the risks? Generally, this is a safe procedure. However, problems may occur, including: Bleeding. Damage to your intestine. Allergic reactions to medicines given during the procedure. Infection. This is rare. What happens before the procedure? Eating and drinking restrictions  Follow instructions from your health care provider about eating or drinking restrictions, which may include: A few days before the procedure: Follow a low-fiber diet. Avoid nuts, seeds, dried fruit, raw fruits, and vegetables. 1-3 days before the procedure: Eat only gelatin dessert or ice pops. Drink only clear liquids, such as water , clear juice, clear broth or bouillon, black coffee or tea, or clear soft drinks or sports drinks. Avoid  liquids that contain red or purple dye. The day of the procedure: Do not eat solid foods. You may continue to drink clear liquids until up to 2 hours before the procedure. Do not eat or drink anything starting 2 hours before the procedure, or within the time period that your health care provider recommends. Bowel prep If you were prescribed a bowel prep to take by mouth (orally) to clean out your colon: Take it as told by your health care provider. Starting the day before your procedure, you will need to drink a large amount of liquid medicine. The liquid will cause you to have many bowel movements of loose stool until your stool becomes almost clear or light green. If your skin or the opening between the buttocks (anus) gets irritated from diarrhea, you may relieve the irritation using: Wipes with medicine in them, such as adult wet wipes with aloe and vitamin E. A product to soothe skin, such as petroleum jelly. If you vomit while drinking the bowel prep: Take a break for up to 60 minutes. Begin the bowel prep again. Call your health care provider if you keep vomiting or you cannot take the bowel prep without vomiting. To clean out your colon, you may also be given: Laxative medicines. These help you have a bowel movement. Instructions for enema use. An enema is liquid medicine injected into your rectum. Medicines Ask your health care provider about: Changing or stopping your regular medicines or supplements. This is especially important if you are taking iron supplements, diabetes medicines, or blood thinners. Taking medicines such as aspirin and ibuprofen. These medicines can thin your blood. Do not take these medicines unless your health care provider tells you to take them. Taking over-the-counter medicines, vitamins, herbs, and supplements. General instructions Ask your health care provider what steps will be taken to help prevent infection. These may include washing skin with a  germ-killing soap. If you will be going home right after the procedure, plan to have a responsible adult: Take you home from the hospital or clinic. You will not be allowed to drive. Care for you for the time you are told. What happens during the procedure?  An IV will be inserted into one of your veins. You will be given a medicine to make you fall asleep (general anesthetic). You will lie on your side with your knees bent. A lubricant will be put on the tube. Then the tube will be: Inserted into your anus. Gently eased through all parts of your large intestine. Air will be sent into your colon to keep it open. This may cause some pressure or cramping. Images will be taken with the camera and will appear on a screen. A small tissue sample may be removed to be looked at under a microscope (biopsy). The tissue may be sent to a lab for testing if any signs of problems are found. If small polyps are found, they may be removed and checked for cancer cells. When the procedure is finished, the tube will be removed. The procedure may vary among health care providers  and hospitals. What happens after the procedure? Your blood pressure, heart rate, breathing rate, and blood oxygen level will be monitored until you leave the hospital or clinic. You may have a small amount of blood in your stool. You may pass gas and have mild cramping or bloating in your abdomen. This is caused by the air that was used to open your colon during the exam. If you were given a sedative during the procedure, it can affect you for several hours. Do not drive or operate machinery until your health care provider says that it is safe. It is up to you to get the results of your procedure. Ask your health care provider, or the department that is doing the procedure, when your results will be ready. Summary A colonoscopy is a procedure to look at the entire large intestine. Follow instructions from your health care provider  about eating and drinking before the procedure. If you were prescribed an oral bowel prep to clean out your colon, take it as told by your health care provider. During the colonoscopy, a flexible tube with a camera on its end is inserted into the anus and then passed into all parts of the large intestine. This information is not intended to replace advice given to you by your health care provider. Make sure you discuss any questions you have with your health care provider. Document Revised: 10/03/2022 Document Reviewed: 04/13/2021 Elsevier Patient Education  2024 [employer].Monitored Anesthesia Care, Care After The following information offers guidance on how to care for yourself after your procedure. Your health care provider may also give you more specific instructions. If you have problems or questions, contact your health care provider. What can I expect after the procedure? After the procedure, it is common to have: Tiredness. Little or no memory about what happened during or after the procedure. Impaired judgment when it comes to making decisions. Nausea or vomiting. Some trouble with balance. Follow these instructions at home: For the time period you were told by your health care provider:  Rest. Do not participate in activities where you could fall or become injured. Do not drive or use machinery. Do not drink alcohol. Do not take sleeping pills or medicines that cause drowsiness. Do not make important decisions or sign legal documents. Do not take care of children on your own. Medicines Take over-the-counter and prescription medicines only as told by your health care provider. If you were prescribed antibiotics, take them as told by your health care provider. Do not stop using the antibiotic even if you start to feel better. Eating and drinking Follow instructions from your health care provider about what you may eat and drink. Drink enough fluid to keep your urine pale  yellow. If you vomit: Drink clear fluids slowly and in small amounts as you are able. Clear fluids include water , ice chips, low-calorie sports drinks, and fruit juice that has water  added to it (diluted fruit juice). Eat light and bland foods in small amounts as you are able. These foods include bananas, applesauce, rice, lean meats, toast, and crackers. General instructions  Have a responsible adult stay with you for the time you are told. It is important to have someone help care for you until you are awake and alert. If you have sleep apnea, surgery and some medicines can increase your risk for breathing problems. Follow instructions from your health care provider about wearing your sleep device: When you are sleeping. This includes during daytime naps. While taking  prescription pain medicines, sleeping medicines, or medicines that make you drowsy. Do not use any products that contain nicotine or tobacco. These products include cigarettes, chewing tobacco, and vaping devices, such as e-cigarettes. If you need help quitting, ask your health care provider. Contact a health care provider if: You feel nauseous or vomit every time you eat or drink. You feel light-headed. You are still sleepy or having trouble with balance after 24 hours. You get a rash. You have a fever. You have redness or swelling around the IV site. Get help right away if: You have trouble breathing. You have new confusion after you get home. These symptoms may be an emergency. Get help right away. Call 911. Do not wait to see if the symptoms will go away. Do not drive yourself to the hospital. This information is not intended to replace advice given to you by your health care provider. Make sure you discuss any questions you have with your health care provider. Document Revised: 01/16/2022 Document Reviewed: 01/16/2022 Elsevier Patient Education  2024 [employer].

## 2024-02-29 ENCOUNTER — Encounter (HOSPITAL_COMMUNITY)
Admission: RE | Admit: 2024-02-29 | Discharge: 2024-02-29 | Disposition: A | Discharge status: Home / Self Care | Source: Ambulatory Visit | Attending: Gastroenterology | Admitting: Gastroenterology

## 2024-02-29 ENCOUNTER — Other Ambulatory Visit: Payer: Self-pay

## 2024-02-29 ENCOUNTER — Ambulatory Visit (INDEPENDENT_AMBULATORY_CARE_PROVIDER_SITE_OTHER): Payer: Self-pay | Admitting: Gastroenterology

## 2024-02-29 ENCOUNTER — Encounter (HOSPITAL_COMMUNITY): Payer: Self-pay

## 2024-02-29 VITALS — BP 128/97 | HR 82 | Resp 16 | Ht 67.0 in | Wt 320.6 lb

## 2024-02-29 DIAGNOSIS — I10 Essential (primary) hypertension: Secondary | ICD-10-CM | POA: Diagnosis not present

## 2024-02-29 DIAGNOSIS — Z01810 Encounter for preprocedural cardiovascular examination: Secondary | ICD-10-CM | POA: Insufficient documentation

## 2024-03-03 DIAGNOSIS — I10 Essential (primary) hypertension: Secondary | ICD-10-CM | POA: Diagnosis not present

## 2024-03-04 ENCOUNTER — Encounter (HOSPITAL_COMMUNITY)
Admission: RE | Disposition: A | Discharge status: Home / Self Care | Payer: Self-pay | Source: Home / Self Care | Attending: Gastroenterology

## 2024-03-04 ENCOUNTER — Ambulatory Visit (HOSPITAL_COMMUNITY)

## 2024-03-04 ENCOUNTER — Encounter (INDEPENDENT_AMBULATORY_CARE_PROVIDER_SITE_OTHER): Payer: Self-pay | Admitting: *Deleted

## 2024-03-04 ENCOUNTER — Other Ambulatory Visit: Payer: Self-pay

## 2024-03-04 ENCOUNTER — Ambulatory Visit (HOSPITAL_COMMUNITY)
Admission: RE | Admit: 2024-03-04 | Discharge: 2024-03-04 | Disposition: A | Discharge status: Home / Self Care | Attending: Gastroenterology | Admitting: Gastroenterology

## 2024-03-04 ENCOUNTER — Encounter (HOSPITAL_COMMUNITY): Payer: Self-pay | Admitting: Gastroenterology

## 2024-03-04 DIAGNOSIS — Z1211 Encounter for screening for malignant neoplasm of colon: Secondary | ICD-10-CM | POA: Diagnosis not present

## 2024-03-04 DIAGNOSIS — I10 Essential (primary) hypertension: Secondary | ICD-10-CM | POA: Insufficient documentation

## 2024-03-04 DIAGNOSIS — K635 Polyp of colon: Secondary | ICD-10-CM | POA: Insufficient documentation

## 2024-03-04 DIAGNOSIS — D123 Benign neoplasm of transverse colon: Secondary | ICD-10-CM | POA: Diagnosis not present

## 2024-03-04 DIAGNOSIS — K5730 Diverticulosis of large intestine without perforation or abscess without bleeding: Secondary | ICD-10-CM | POA: Diagnosis not present

## 2024-03-04 DIAGNOSIS — M1990 Unspecified osteoarthritis, unspecified site: Secondary | ICD-10-CM | POA: Insufficient documentation

## 2024-03-04 DIAGNOSIS — E039 Hypothyroidism, unspecified: Secondary | ICD-10-CM | POA: Insufficient documentation

## 2024-03-04 DIAGNOSIS — Z87891 Personal history of nicotine dependence: Secondary | ICD-10-CM | POA: Diagnosis not present

## 2024-03-04 DIAGNOSIS — Z860101 Personal history of adenomatous and serrated colon polyps: Secondary | ICD-10-CM | POA: Diagnosis not present

## 2024-03-04 DIAGNOSIS — D124 Benign neoplasm of descending colon: Secondary | ICD-10-CM | POA: Diagnosis not present

## 2024-03-04 DIAGNOSIS — K648 Other hemorrhoids: Secondary | ICD-10-CM | POA: Diagnosis not present

## 2024-03-04 HISTORY — PX: COLONOSCOPY: SHX5424

## 2024-03-04 LAB — HM COLONOSCOPY

## 2024-03-04 SURGERY — COLONOSCOPY
Anesthesia: General

## 2024-03-04 MED ORDER — PHENYLEPHRINE 80 MCG/ML (10ML) SYRINGE FOR IV PUSH (FOR BLOOD PRESSURE SUPPORT)
PREFILLED_SYRINGE | INTRAVENOUS | Status: AC
  Filled 2024-03-04: qty 20

## 2024-03-04 MED ORDER — PHENYLEPHRINE 80 MCG/ML (10ML) SYRINGE FOR IV PUSH (FOR BLOOD PRESSURE SUPPORT)
PREFILLED_SYRINGE | INTRAVENOUS | Status: DC | PRN
  Administered 2024-03-04 (×5): 160 ug via INTRAVENOUS

## 2024-03-04 MED ORDER — LIDOCAINE 2% (20 MG/ML) 5 ML SYRINGE
INTRAMUSCULAR | Status: AC
  Filled 2024-03-04: qty 20

## 2024-03-04 MED ORDER — LACTATED RINGERS IV SOLN: INTRAVENOUS | Status: DC

## 2024-03-04 MED ORDER — LIDOCAINE 2% (20 MG/ML) 5 ML SYRINGE
INTRAMUSCULAR | Status: DC | PRN
  Administered 2024-03-04: 100 mg via INTRAVENOUS

## 2024-03-04 MED ORDER — EPHEDRINE 5 MG/ML INJ
INTRAVENOUS | Status: AC
  Filled 2024-03-04: qty 5

## 2024-03-04 MED ORDER — PROPOFOL 500 MG/50ML IV EMUL
INTRAVENOUS | Status: DC | PRN
  Administered 2024-03-04: 200 ug/kg/min via INTRAVENOUS

## 2024-03-04 MED ORDER — PROPOFOL 10 MG/ML IV BOLUS
INTRAVENOUS | Status: DC | PRN
  Administered 2024-03-04: 100 mg via INTRAVENOUS
  Administered 2024-03-04: 50 mg via INTRAVENOUS

## 2024-03-04 NOTE — Progress Notes (Signed)
report faxed to PCP  

## 2024-03-04 NOTE — Interval H&P Note (Signed)
 History and Physical Interval Note:  03/04/2024 7:34 AM  Alexa [name]  has presented today for surgery, with the diagnosis of hx tublar adenoma, rectal bleeding.  The various methods of treatment have been discussed with the patient and family. After consideration of risks, benefits and other options for treatment, the patient has consented to  Procedure(s) with comments: COLONOSCOPY (N/A) - 730am, asa 3 as a surgical intervention.  The patient's history has been reviewed, patient examined, no change in status, stable for surgery.  I have reviewed the patient's chart and labs.  Questions were answered to the patient's satisfaction.     Alexa [name]

## 2024-03-04 NOTE — Discharge Instructions (Signed)
 You are being discharged to home.  Resume your previous diet.  We are waiting for your pathology results.  Your physician has recommended a repeat colonoscopy in three years for surveillance.

## 2024-03-04 NOTE — Anesthesia Preprocedure Evaluation (Addendum)
 Anesthesia Evaluation  Patient identified by MRN, date of birth, ID band Patient awake    Reviewed: Allergy & Precautions, H&P , NPO status , Patient's Chart, lab work & pertinent test results  Airway Mallampati: III  TM Distance: >3 FB Neck ROM: Full    Dental no notable dental hx.    Pulmonary shortness of breath, former smoker   Pulmonary exam normal breath sounds clear to auscultation       Cardiovascular hypertension, Normal cardiovascular exam Rhythm:Regular Rate:Normal     Neuro/Psych negative neurological ROS  negative psych ROS   GI/Hepatic negative GI ROS, Neg liver ROS,,,  Endo/Other  Hypothyroidism    Renal/GU Renal disease  negative genitourinary   Musculoskeletal  (+) Arthritis ,    Abdominal  (+) + obese  Peds negative pediatric ROS (+)  Hematology negative hematology ROS (+)   Anesthesia Other Findings   Reproductive/Obstetrics negative OB ROS                             Anesthesia Physical Anesthesia Plan  ASA: 2  Anesthesia Plan: General   Post-op Pain Management:    Induction: Intravenous  PONV Risk Score and Plan: Propofol  infusion  Airway Management Planned: Nasal Cannula  Additional Equipment:   Intra-op Plan:   Post-operative Plan:   Informed Consent: I have reviewed the patients History and Physical, chart, labs and discussed the procedure including the risks, benefits and alternatives for the proposed anesthesia with the patient or authorized representative who has indicated his/her understanding and acceptance.     Dental advisory given  Plan Discussed with: CRNA  Anesthesia Plan Comments:        Anesthesia Quick Evaluation

## 2024-03-04 NOTE — Anesthesia Postprocedure Evaluation (Signed)
 Anesthesia Post Note  Patient: Alexa [name]  Procedure(s) Performed: COLONOSCOPY  Patient location during evaluation: PACU Anesthesia Type: General Level of consciousness: awake and alert Pain management: pain level controlled Vital Signs Assessment: post-procedure vital signs reviewed and stable Respiratory status: spontaneous breathing, nonlabored ventilation, respiratory function stable and patient connected to nasal cannula oxygen Cardiovascular status: stable and blood pressure returned to baseline Postop Assessment: no apparent nausea or vomiting Anesthetic complications: no  No notable events documented.   Last Vitals:  Vitals:   03/04/24 0639 03/04/24 0814  BP: (!) 137/53 96/60  Pulse: 76 76  Resp: 19 20  Temp: 36.8 C 36.4 C  SpO2: 100% 100%    Last Pain:  Vitals:   03/04/24 0814  TempSrc: Oral  PainSc: 0-No pain                 Andrea Limes

## 2024-03-04 NOTE — Op Note (Signed)
 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 Estimated Blood Loss:     Estimated blood loss: none. Procedure:                Pre-Anesthesia Assessment:                           - Prior to the procedure, a History and Physical                            was performed, and patient medications, allergies                            and sensitivities were reviewed. The patient's                            tolerance of previous anesthesia was reviewed.                           - The risks and benefits of the procedure and the                            sedation options and risks were discussed with the                            patient. All questions were answered and informed                            consent  was obtained.                           - ASA Grade Assessment: II - A patient with mild                            systemic disease.                           After obtaining informed consent, the colonoscope                            was passed under direct vision. Throughout the                            procedure, the patient's blood pressure, pulse, and                             oxygen saturations were monitored continuously. The                            [id number]-HQ190L ([id number]) scope was introduced through                            the anus and advanced to the the cecum, identified                            by appendiceal orifice and ileocecal valve. The                            colonoscopy was performed without difficulty. The                            patient tolerated the procedure well. The quality                            of the bowel preparation was good. Scope In: 7:45:00 AM Scope Out: 8:09:54 AM Scope Withdrawal Time: 0 hours 16 minutes 15 seconds  Total Procedure Duration: 0 hours 24 minutes 54 seconds  Findings:      The perianal and digital rectal examinations were normal.      Three sessile polyps were found in the transverse colon. The polyps were       4 to 8 mm in size. These polyps were removed with a cold snare.       Resection and retrieval were complete.      Three sessile polyps were found in the descending colon. The polyps were       4 to 8 mm in size. These polyps were removed with a cold snare.       Resection and retrieval were complete.      A few small-mouthed diverticula were found in the sigmoid colon.      Non-bleeding internal hemorrhoids were found during retroflexion. The       hemorrhoids were small. Impression:               - Three 4 to 8 mm  polyps in the transverse colon,                            removed with a cold snare. Resected and retrieved.                           - Three 4 to 8 mm polyps in the descending colon,                            removed with a cold snare. Resected and retrieved.                           - Diverticulosis in the sigmoid colon.                           - Non-bleeding internal hemorrhoids. Moderate Sedation:      Per Anesthesia Care Recommendation:           - Discharge patient to home (ambulatory).                           - Resume previous diet.                           - Await  pathology results.                           - Repeat colonoscopy in 3 years for surveillance. Procedure Code(s):        --- Professional ---                           [phone], Colonoscopy, flexible; with removal of                            tumor(s), polyp(s), or other lesion(s) by snare                            technique Diagnosis Code(s):        --- Professional ---                           D12.3, Benign neoplasm of transverse colon (hepatic                            flexure or splenic flexure)                           D12.4, Benign neoplasm of descending colon                           K64.8, Other hemorrhoids                           Z86.010, Personal history of colonic polyps                           K57.30, Diverticulosis of  large intestine without                            perforation or abscess without bleeding CPT copyright 2022 American Medical Association. All rights reserved. The codes documented in this report are preliminary and upon coder review may  be revised to meet current compliance requirements. Toribio Fortune, MD Toribio Fortune,  03/04/2024 8:17:13 AM This report has been signed electronically. Number of Addenda: 0

## 2024-03-04 NOTE — Transfer of Care (Signed)
 Immediate Anesthesia Transfer of Care Note  Patient: SIERRIA BRUNEY  Procedure(s) Performed: COLONOSCOPY  Patient Location: Endoscopy Unit  Anesthesia Type:General  Level of Consciousness: awake, alert , oriented, and patient cooperative  Airway & Oxygen Therapy: Patient Spontanous Breathing  Post-op Assessment: Report given to RN, Post -op Vital signs reviewed and stable, and Patient moving all extremities X 4  Post vital signs: Reviewed and stable  Last Vitals:  Vitals Value Taken Time  BP 96/60 03/04/24 08:14  Temp 36.4 C 03/04/24 08:14  Pulse 76 03/04/24 08:14  Resp 20 03/04/24 08:14  SpO2 100 % 03/04/24 08:14    Last Pain:  Vitals:   03/04/24 0814  TempSrc: Oral  PainSc: 0-No pain         Complications: No notable events documented.

## 2024-03-05 ENCOUNTER — Encounter (HOSPITAL_COMMUNITY): Payer: Self-pay | Admitting: Gastroenterology

## 2024-03-05 LAB — SURGICAL PATHOLOGY

## 2024-03-06 ENCOUNTER — Ambulatory Visit (INDEPENDENT_AMBULATORY_CARE_PROVIDER_SITE_OTHER): Payer: Self-pay | Admitting: Gastroenterology

## 2024-03-06 DIAGNOSIS — R944 Abnormal results of kidney function studies: Secondary | ICD-10-CM | POA: Diagnosis not present

## 2024-03-06 DIAGNOSIS — I10 Essential (primary) hypertension: Secondary | ICD-10-CM | POA: Diagnosis not present

## 2024-03-06 DIAGNOSIS — R600 Localized edema: Secondary | ICD-10-CM | POA: Diagnosis not present

## 2024-03-06 NOTE — Progress Notes (Signed)
 3 yr TCS noted in recall Patient result letter mailed procedure note and pathology result faxed to PCP

## 2024-04-15 DIAGNOSIS — I10 Essential (primary) hypertension: Secondary | ICD-10-CM | POA: Diagnosis not present

## 2024-04-15 DIAGNOSIS — E039 Hypothyroidism, unspecified: Secondary | ICD-10-CM | POA: Diagnosis not present

## 2024-04-15 DIAGNOSIS — R800 Isolated proteinuria: Secondary | ICD-10-CM | POA: Diagnosis not present

## 2024-05-06 DIAGNOSIS — R800 Isolated proteinuria: Secondary | ICD-10-CM | POA: Diagnosis not present

## 2024-05-06 DIAGNOSIS — Z0001 Encounter for general adult medical examination with abnormal findings: Secondary | ICD-10-CM | POA: Diagnosis not present

## 2024-05-06 DIAGNOSIS — I10 Essential (primary) hypertension: Secondary | ICD-10-CM | POA: Diagnosis not present

## 2024-05-06 DIAGNOSIS — R7303 Prediabetes: Secondary | ICD-10-CM | POA: Diagnosis not present

## 2024-05-06 DIAGNOSIS — E039 Hypothyroidism, unspecified: Secondary | ICD-10-CM | POA: Diagnosis not present

## 2024-05-06 DIAGNOSIS — R161 Splenomegaly, not elsewhere classified: Secondary | ICD-10-CM | POA: Diagnosis not present

## 2024-05-06 DIAGNOSIS — D696 Thrombocytopenia, unspecified: Secondary | ICD-10-CM | POA: Diagnosis not present

## 2024-05-06 DIAGNOSIS — K760 Fatty (change of) liver, not elsewhere classified: Secondary | ICD-10-CM | POA: Diagnosis not present

## 2024-05-06 DIAGNOSIS — R944 Abnormal results of kidney function studies: Secondary | ICD-10-CM | POA: Diagnosis not present

## 2024-05-06 DIAGNOSIS — R600 Localized edema: Secondary | ICD-10-CM | POA: Diagnosis not present

## 2024-05-06 DIAGNOSIS — B351 Tinea unguium: Secondary | ICD-10-CM | POA: Diagnosis not present

## 2024-05-27 DIAGNOSIS — L03116 Cellulitis of left lower limb: Secondary | ICD-10-CM | POA: Diagnosis not present

## 2024-05-27 DIAGNOSIS — R944 Abnormal results of kidney function studies: Secondary | ICD-10-CM | POA: Diagnosis not present

## 2024-05-27 DIAGNOSIS — R600 Localized edema: Secondary | ICD-10-CM | POA: Diagnosis not present

## 2024-05-27 DIAGNOSIS — I10 Essential (primary) hypertension: Secondary | ICD-10-CM | POA: Diagnosis not present

## 2024-06-03 DIAGNOSIS — M25562 Pain in left knee: Secondary | ICD-10-CM | POA: Diagnosis not present

## 2024-06-03 DIAGNOSIS — M25561 Pain in right knee: Secondary | ICD-10-CM | POA: Diagnosis not present

## 2024-06-18 ENCOUNTER — Encounter (INDEPENDENT_AMBULATORY_CARE_PROVIDER_SITE_OTHER): Payer: Self-pay | Admitting: Gastroenterology

## 2024-07-07 ENCOUNTER — Encounter: Payer: Self-pay | Admitting: Radiology
